# Patient Record
Sex: Female | Born: 1965 | Race: White | Hispanic: No | Marital: Married | Smoking: Never smoker
Health system: Southern US, Community
[De-identification: ages and names within clinical notes are randomized; demographics above are authoritative.]

## PROBLEM LIST (undated history)

## (undated) DIAGNOSIS — I10 Essential (primary) hypertension: Secondary | ICD-10-CM

## (undated) DIAGNOSIS — E119 Type 2 diabetes mellitus without complications: Secondary | ICD-10-CM

## (undated) DIAGNOSIS — E785 Hyperlipidemia, unspecified: Secondary | ICD-10-CM

## (undated) DIAGNOSIS — T7840XA Allergy, unspecified, initial encounter: Secondary | ICD-10-CM

## (undated) DIAGNOSIS — F419 Anxiety disorder, unspecified: Secondary | ICD-10-CM

## (undated) HISTORY — DX: Allergy, unspecified, initial encounter: T78.40XA

## (undated) HISTORY — DX: Hyperlipidemia, unspecified: E78.5

## (undated) HISTORY — DX: Anxiety disorder, unspecified: F41.9

## (undated) HISTORY — DX: Essential (primary) hypertension: I10

## (undated) HISTORY — PX: DILATION AND CURETTAGE OF UTERUS: SHX78

## (undated) HISTORY — PX: HERNIA REPAIR: SHX51

## (undated) HISTORY — DX: Type 2 diabetes mellitus without complications: E11.9

---

## 1997-04-19 ENCOUNTER — Other Ambulatory Visit: Admission: RE | Admit: 1997-04-19 | Discharge: 1997-04-19 | Payer: Self-pay | Admitting: Obstetrics and Gynecology

## 1997-09-07 ENCOUNTER — Ambulatory Visit (HOSPITAL_COMMUNITY): Admission: RE | Admit: 1997-09-07 | Discharge: 1997-09-07 | Payer: Self-pay | Admitting: Obstetrics and Gynecology

## 1997-10-28 ENCOUNTER — Inpatient Hospital Stay (HOSPITAL_COMMUNITY): Admission: AD | Admit: 1997-10-28 | Discharge: 1997-10-31 | Payer: Self-pay | Admitting: Obstetrics and Gynecology

## 1997-12-06 ENCOUNTER — Other Ambulatory Visit: Admission: RE | Admit: 1997-12-06 | Discharge: 1997-12-06 | Payer: Self-pay | Admitting: Obstetrics and Gynecology

## 2003-02-07 ENCOUNTER — Other Ambulatory Visit: Admission: RE | Admit: 2003-02-07 | Discharge: 2003-02-07 | Payer: Self-pay | Admitting: Family Medicine

## 2004-03-20 ENCOUNTER — Encounter: Admission: RE | Admit: 2004-03-20 | Discharge: 2004-03-20 | Payer: Self-pay | Admitting: Family Medicine

## 2004-10-24 ENCOUNTER — Ambulatory Visit: Payer: Self-pay | Admitting: Family Medicine

## 2004-10-24 ENCOUNTER — Other Ambulatory Visit: Admission: RE | Admit: 2004-10-24 | Discharge: 2004-10-24 | Payer: Self-pay | Admitting: Family Medicine

## 2005-03-05 ENCOUNTER — Ambulatory Visit: Payer: Self-pay | Admitting: Family Medicine

## 2005-03-18 ENCOUNTER — Ambulatory Visit: Payer: Self-pay | Admitting: Family Medicine

## 2005-03-21 ENCOUNTER — Encounter: Admission: RE | Admit: 2005-03-21 | Discharge: 2005-03-21 | Payer: Self-pay | Admitting: Family Medicine

## 2006-05-21 ENCOUNTER — Encounter: Admission: RE | Admit: 2006-05-21 | Discharge: 2006-05-21 | Payer: Self-pay | Admitting: Family Medicine

## 2008-05-24 ENCOUNTER — Encounter: Admission: RE | Admit: 2008-05-24 | Discharge: 2008-05-24 | Payer: Self-pay | Admitting: Family Medicine

## 2009-11-23 ENCOUNTER — Encounter: Admission: RE | Admit: 2009-11-23 | Discharge: 2009-11-23 | Payer: Self-pay | Admitting: Family Medicine

## 2011-06-05 ENCOUNTER — Other Ambulatory Visit: Payer: Self-pay | Admitting: Family Medicine

## 2011-06-05 DIAGNOSIS — Z1231 Encounter for screening mammogram for malignant neoplasm of breast: Secondary | ICD-10-CM

## 2011-07-09 ENCOUNTER — Ambulatory Visit
Admission: RE | Admit: 2011-07-09 | Discharge: 2011-07-09 | Disposition: A | Discharge status: Home / Self Care | Payer: 59 | Source: Ambulatory Visit | Attending: Family Medicine | Admitting: Family Medicine

## 2011-07-09 DIAGNOSIS — Z1231 Encounter for screening mammogram for malignant neoplasm of breast: Secondary | ICD-10-CM

## 2012-06-04 ENCOUNTER — Other Ambulatory Visit: Payer: Self-pay

## 2012-06-04 DIAGNOSIS — Z1231 Encounter for screening mammogram for malignant neoplasm of breast: Secondary | ICD-10-CM

## 2012-07-09 ENCOUNTER — Ambulatory Visit
Admission: RE | Admit: 2012-07-09 | Discharge: 2012-07-09 | Disposition: A | Discharge status: Home / Self Care | Payer: 59 | Source: Ambulatory Visit

## 2012-07-09 DIAGNOSIS — Z1231 Encounter for screening mammogram for malignant neoplasm of breast: Secondary | ICD-10-CM

## 2014-10-28 ENCOUNTER — Other Ambulatory Visit: Payer: Self-pay

## 2014-10-28 DIAGNOSIS — Z1231 Encounter for screening mammogram for malignant neoplasm of breast: Secondary | ICD-10-CM

## 2014-11-24 ENCOUNTER — Ambulatory Visit
Admission: RE | Admit: 2014-11-24 | Discharge: 2014-11-24 | Disposition: A | Discharge status: Home / Self Care | Payer: 59 | Source: Ambulatory Visit

## 2014-11-24 DIAGNOSIS — Z1231 Encounter for screening mammogram for malignant neoplasm of breast: Secondary | ICD-10-CM

## 2016-10-09 ENCOUNTER — Other Ambulatory Visit: Payer: Self-pay | Admitting: Family Medicine

## 2016-10-09 DIAGNOSIS — Z1231 Encounter for screening mammogram for malignant neoplasm of breast: Secondary | ICD-10-CM

## 2016-10-10 ENCOUNTER — Ambulatory Visit
Admission: RE | Admit: 2016-10-10 | Discharge: 2016-10-10 | Disposition: A | Discharge status: Home / Self Care | Payer: 59 | Source: Ambulatory Visit | Attending: Family Medicine | Admitting: Family Medicine

## 2016-10-10 DIAGNOSIS — Z1231 Encounter for screening mammogram for malignant neoplasm of breast: Secondary | ICD-10-CM

## 2019-07-02 ENCOUNTER — Other Ambulatory Visit: Payer: Self-pay | Admitting: Family Medicine

## 2019-07-02 DIAGNOSIS — Z1231 Encounter for screening mammogram for malignant neoplasm of breast: Secondary | ICD-10-CM

## 2019-07-09 ENCOUNTER — Encounter: Payer: Self-pay | Admitting: Gastroenterology

## 2019-07-15 ENCOUNTER — Other Ambulatory Visit: Payer: Self-pay

## 2019-07-15 ENCOUNTER — Other Ambulatory Visit: Payer: Self-pay | Admitting: Obstetrics and Gynecology

## 2019-07-15 ENCOUNTER — Ambulatory Visit
Admission: RE | Admit: 2019-07-15 | Discharge: 2019-07-15 | Disposition: A | Discharge status: Home / Self Care | Payer: 59 | Source: Ambulatory Visit | Attending: Family Medicine | Admitting: Family Medicine

## 2019-07-15 DIAGNOSIS — Z1231 Encounter for screening mammogram for malignant neoplasm of breast: Secondary | ICD-10-CM

## 2019-08-16 ENCOUNTER — Ambulatory Visit (AMBULATORY_SURGERY_CENTER): Payer: Self-pay | Admitting: *Deleted

## 2019-08-16 ENCOUNTER — Other Ambulatory Visit: Payer: Self-pay

## 2019-08-16 ENCOUNTER — Encounter: Payer: Self-pay | Admitting: Gastroenterology

## 2019-08-16 VITALS — Ht 62.0 in | Wt 226.8 lb

## 2019-08-16 DIAGNOSIS — Z1211 Encounter for screening for malignant neoplasm of colon: Secondary | ICD-10-CM

## 2019-08-16 MED ORDER — SUTAB 1479-225-188 MG PO TABS: 1.0000 | ORAL_TABLET | Freq: Once | ORAL | 0 refills | Status: AC

## 2019-08-16 NOTE — Progress Notes (Signed)
Completed covid vaccine 05-04-19  Pt is aware that care partner will wait in the car during procedure; if they feel like they will be too hot or cold to wait in the car; they may wait in the 4 th floor lobby. Patient is aware to bring only one care partner. We want them to wear a mask (we do not have any that we can provide them), practice social distancing, and we will check their temperatures when they get here.  I did remind the patient that their care partner needs to stay in the parking lot the entire time and have a cell phone available, we will call them when the pt is ready for discharge. Patient will wear mask into building.   No trouble with anesthesia, difficulty with intubation or hx/fam hx of malignant hyperthermia per pt   No egg or soy allergy  No home oxygen use   No medications for weight loss taken  emmi information given  Pt denies constipation issues   Sutab code put into RX and paper copy given to pt to show pharmacy

## 2019-09-02 ENCOUNTER — Ambulatory Visit (AMBULATORY_SURGERY_CENTER): Payer: 59 | Admitting: Gastroenterology

## 2019-09-02 ENCOUNTER — Other Ambulatory Visit: Payer: Self-pay

## 2019-09-02 ENCOUNTER — Encounter: Payer: Self-pay | Admitting: Gastroenterology

## 2019-09-02 VITALS — BP 113/68 | HR 68 | Temp 97.3°F | Resp 16 | Ht 62.0 in | Wt 226.0 lb

## 2019-09-02 DIAGNOSIS — Z1211 Encounter for screening for malignant neoplasm of colon: Secondary | ICD-10-CM

## 2019-09-02 DIAGNOSIS — D122 Benign neoplasm of ascending colon: Secondary | ICD-10-CM

## 2019-09-02 MED ORDER — SODIUM CHLORIDE 0.9 % IV SOLN: 500.0000 mL | Freq: Once | INTRAVENOUS | Status: DC

## 2019-09-02 NOTE — Progress Notes (Signed)
Report to PACU, RN, vss, BBS= Clear.  

## 2019-09-02 NOTE — Progress Notes (Signed)
No problems noted in the recovery room. maw 

## 2019-09-02 NOTE — Op Note (Signed)
[place] Patient Name: Brittany Reyes Procedure Date: 09/02/2019 7:37 AM MRN: [id number] Endoscopist: Mauri Pole , MD Age: 54 Referring MD:  Date of Birth: 28-Feb-1965 Gender: Female Account #: [id number] Procedure:                Colonoscopy Indications:              Screening for colorectal malignant neoplasm Medicines:                Monitored Anesthesia Care Procedure:                Pre-Anesthesia Assessment:                           - Prior to the procedure, a History and Physical                            was performed, and patient medications and                            allergies were reviewed. The patient's tolerance of                            previous anesthesia was also reviewed. The risks                            and benefits of the procedure and the sedation                            options and risks were discussed with the patient.                            All questions were answered, and informed consent                            was obtained. Prior Anticoagulants: The patient has                            taken no previous anticoagulant or antiplatelet                            agents. ASA Grade Assessment: II - A patient with                            mild systemic disease. After reviewing the risks                            and benefits, the patient was deemed in                            satisfactory condition to undergo the procedure.                           After obtaining informed consent, the colonoscope  was passed under direct vision. Throughout the                            procedure, the patient's blood pressure, pulse, and                            oxygen saturations were monitored continuously. The                            Colonoscope was introduced through the anus and                            advanced to the the cecum, identified by                            appendiceal orifice and  ileocecal valve. The                            colonoscopy was performed without difficulty. The                            patient tolerated the procedure well. The quality                            of the bowel preparation was adequate to identify                            polyps 6 mm and larger in size. The ileocecal                            valve, appendiceal orifice, and rectum were                            photographed. Scope In: 8:09:22 AM Scope Out: 8:27:03 AM Scope Withdrawal Time: 0 hours 10 minutes 8 seconds  Total Procedure Duration: 0 hours 17 minutes 41 seconds  Findings:                 The perianal and digital rectal examinations were                            normal.                           A less than 1 mm polyp was found in the ascending                            colon. The polyp was sessile. The polyp was removed                            with a hot snare. Resection was complete, and                            retrieval was complete.  A few small-mouthed diverticula were found in the                            sigmoid colon.                           Non-bleeding internal hemorrhoids were found during                            retroflexion. The hemorrhoids were small. Complications:            No immediate complications. Estimated Blood Loss:     Estimated blood loss was minimal. Impression:               - One less than 1 mm polyp in the ascending colon,                            removed with a hot snare. Resected and retrieved.                           - Diverticulosis in the sigmoid colon.                           - Non-bleeding internal hemorrhoids. Recommendation:           - Patient has a contact number available for                            emergencies. The signs and symptoms of potential                            delayed complications were discussed with the                            patient. Return to normal  activities tomorrow.                            Written discharge instructions were provided to the                            patient.                           - Resume previous diet.                           - Continue present medications.                           - Await pathology results.                           - Repeat colonoscopy in 5 years for surveillance                            based on pathology results.                           -  For future colonoscopy the patient will require                            an extended preparation. If there are any                            questions, please contact the gastroenterologist. Mauri Pole, MD 09/02/2019 8:39:33 AM This report has been signed electronically.

## 2019-09-02 NOTE — Patient Instructions (Addendum)
Handouts were given to you on polyps, diverticulosis, and hemorrhoids. Your blood sugar was 94 in the recovery room. You may resume your current medications today. Await biopsy results. Repeat colonoscopy in 5 years for surveillance with a double prep. Please call if any questions or concerns.      YOU HAD AN ENDOSCOPIC PROCEDURE TODAY AT [facility]:   Refer to the procedure report that was given to you for any specific questions about what was found during the examination.  If the procedure report does not answer your questions, please call your gastroenterologist to clarify.  If you requested that your care partner not be given the details of your procedure findings, then the procedure report has been included in a sealed envelope for you to review at your convenience later.  YOU SHOULD EXPECT: Some feelings of bloating in the abdomen. Passage of more gas than usual.  Walking can help get rid of the air that was put into your GI tract during the procedure and reduce the bloating. If you had a lower endoscopy (such as a colonoscopy or flexible sigmoidoscopy) you may notice spotting of blood in your stool or on the toilet paper. If you underwent a bowel prep for your procedure, you may not have a normal bowel movement for a few days.  Please Note:  You might notice some irritation and congestion in your nose or some drainage.  This is from the oxygen used during your procedure.  There is no need for concern and it should clear up in a day or so.  SYMPTOMS TO REPORT IMMEDIATELY:   Following lower endoscopy (colonoscopy or flexible sigmoidoscopy):  Excessive amounts of blood in the stool  Significant tenderness or worsening of abdominal pains  Swelling of the abdomen that is new, acute  Fever of 100F or higher   For urgent or emergent issues, a gastroenterologist can be reached at any hour by calling [phone]. Do not use MyChart messaging for urgent concerns.     DIET:  We do recommend a small meal at first, but then you may proceed to your regular diet.  Drink plenty of fluids but you should avoid alcoholic beverages for 24 hours.  ACTIVITY:  You should plan to take it easy for the rest of today and you should NOT DRIVE or use heavy machinery until tomorrow (because of the sedation medicines used during the test).    FOLLOW UP: Our staff will call the number listed on your records 48-72 hours following your procedure to check on you and address any questions or concerns that you may have regarding the information given to you following your procedure. If we do not reach you, we will leave a message.  We will attempt to reach you two times.  During this call, we will ask if you have developed any symptoms of COVID 19. If you develop any symptoms (ie: fever, flu-like symptoms, shortness of breath, cough etc.) before then, please call [phone].  If you test positive for Covid 19 in the 2 weeks post procedure, please call and report this information to [place].    If any biopsies were taken you will be contacted by phone or by letter within the next 1-3 weeks.  Please call us at [phone] if you have not heard about the biopsies in 3 weeks.    SIGNATURES/CONFIDENTIALITY: You and/or your care partner have signed paperwork which will be entered into your electronic medical record.  These signatures attest to  the fact that that the information above on your After Visit Summary has been reviewed and is understood.  Full responsibility of the confidentiality of this discharge information lies with you and/or your care-partner.

## 2019-09-02 NOTE — Progress Notes (Signed)
DR N aware of Robinul

## 2019-09-02 NOTE — Progress Notes (Signed)
Called to room to assist during endoscopic procedure.  Patient ID and intended procedure confirmed with present staff. Received instructions for my participation in the procedure from the performing physician.  

## 2019-09-06 ENCOUNTER — Telehealth: Payer: Self-pay

## 2019-09-06 NOTE — Telephone Encounter (Signed)
  Follow up Call-  Call back number 09/02/2019  Post procedure Call Back phone  # [phone]  Permission to leave phone message Yes  Some recent data might be hidden     Patient questions:  Do you have a fever, pain , or abdominal swelling? No. Pain Score  0 *  Have you tolerated food without any problems? Yes.    Have you been able to return to your normal activities? Yes.    Do you have any questions about your discharge instructions: Diet   No. Medications  No. Follow up visit  No.  Do you have questions or concerns about your Care? No.  Actions: * If pain score is 4 or above: 1. No action needed, pain <4.Have you developed a fever since your procedure? no  2.   Have you had an respiratory symptoms (SOB or cough) since your procedure? no  3.   Have you tested positive for COVID 19 since your procedure no  4.   Have you had any family members/close contacts diagnosed with the COVID 19 since your procedure?  no   If yes to any of these questions please route to Joylene John, RN and Joella Prince, RN

## 2019-09-10 ENCOUNTER — Encounter: Payer: Self-pay | Admitting: Gastroenterology

## 2020-07-25 LAB — HM MAMMOGRAPHY

## 2020-07-25 LAB — HM DEXA SCAN

## 2020-07-29 LAB — HM PAP SMEAR: HM Pap smear: NORMAL

## 2021-04-27 LAB — HM DIABETES EYE EXAM

## 2021-10-16 ENCOUNTER — Encounter: Payer: Self-pay | Admitting: Family

## 2021-10-16 ENCOUNTER — Ambulatory Visit (INDEPENDENT_AMBULATORY_CARE_PROVIDER_SITE_OTHER): Payer: 59 | Admitting: Family

## 2021-10-16 ENCOUNTER — Telehealth: Payer: Self-pay | Admitting: Family

## 2021-10-16 ENCOUNTER — Telehealth: Payer: Self-pay

## 2021-10-16 VITALS — BP 130/64 | HR 95 | Temp 98.5°F | Ht 63.0 in | Wt 210.0 lb

## 2021-10-16 DIAGNOSIS — E785 Hyperlipidemia, unspecified: Secondary | ICD-10-CM | POA: Diagnosis not present

## 2021-10-16 DIAGNOSIS — Z23 Encounter for immunization: Secondary | ICD-10-CM | POA: Diagnosis not present

## 2021-10-16 DIAGNOSIS — T7840XA Allergy, unspecified, initial encounter: Secondary | ICD-10-CM

## 2021-10-16 DIAGNOSIS — E119 Type 2 diabetes mellitus without complications: Secondary | ICD-10-CM | POA: Diagnosis not present

## 2021-10-16 DIAGNOSIS — I10 Essential (primary) hypertension: Secondary | ICD-10-CM | POA: Diagnosis not present

## 2021-10-16 DIAGNOSIS — F419 Anxiety disorder, unspecified: Secondary | ICD-10-CM | POA: Diagnosis not present

## 2021-10-16 DIAGNOSIS — B009 Herpesviral infection, unspecified: Secondary | ICD-10-CM

## 2021-10-16 LAB — COMPREHENSIVE METABOLIC PANEL
ALT: 11 U/L (ref 0–35)
AST: 13 U/L (ref 0–37)
Albumin: 4.8 g/dL (ref 3.5–5.2)
Alkaline Phosphatase: 49 U/L (ref 39–117)
BUN: 24 mg/dL — ABNORMAL HIGH (ref 6–23)
CO2: 26 mEq/L (ref 19–32)
Calcium: 9.8 mg/dL (ref 8.4–10.5)
Chloride: 99 mEq/L (ref 96–112)
Creatinine, Ser: 0.7 mg/dL (ref 0.40–1.20)
GFR: 95.3 mL/min (ref >60.00)
Glucose, Bld: 116 mg/dL — ABNORMAL HIGH (ref 70–99)
Potassium: 3.5 mEq/L (ref 3.5–5.1)
Sodium: 138 mEq/L (ref 135–145)
Total Bilirubin: 0.8 mg/dL (ref 0.2–1.2)
Total Protein: 7.3 g/dL (ref 6.0–8.3)

## 2021-10-16 LAB — LIPID PANEL
Cholesterol: 152 mg/dL (ref 0–200)
HDL: 48.3 mg/dL (ref >39.00)
LDL Cholesterol: 81 mg/dL (ref 0–99)
NonHDL: 104.19
Total CHOL/HDL Ratio: 3
Triglycerides: 115 mg/dL (ref 0.0–149.0)
VLDL: 23 mg/dL (ref 0.0–40.0)

## 2021-10-16 LAB — MICROALBUMIN / CREATININE URINE RATIO
Creatinine,U: 30.9 mg/dL
Microalb Creat Ratio: 2.3 mg/g (ref 0.0–30.0)
Microalb, Ur: <0.7 mg/dL (ref 0.0–1.9)

## 2021-10-16 LAB — HEMOGLOBIN A1C: Hgb A1c MFr Bld: 6.3 % (ref 4.6–6.5)

## 2021-10-16 MED ORDER — PRAVASTATIN SODIUM 40 MG PO TABS: 40.0000 mg | ORAL_TABLET | Freq: Every day | ORAL | 1 refills | Status: DC

## 2021-10-16 MED ORDER — TELMISARTAN-HCTZ 80-25 MG PO TABS: 1.0000 | ORAL_TABLET | Freq: Every day | ORAL | 1 refills | Status: DC

## 2021-10-16 MED ORDER — EZETIMIBE 10 MG PO TABS: 10.0000 mg | ORAL_TABLET | Freq: Every day | ORAL | 1 refills | Status: DC

## 2021-10-16 MED ORDER — VENLAFAXINE HCL ER 150 MG PO CP24: 150.0000 mg | ORAL_CAPSULE | Freq: Every day | ORAL | 1 refills | Status: DC

## 2021-10-16 MED ORDER — FAMCICLOVIR 500 MG PO TABS: ORAL_TABLET | ORAL | 4 refills | Status: DC

## 2021-10-16 MED ORDER — SYNJARDY XR 25-1000 MG PO TB24: 1.0000 | ORAL_TABLET | Freq: Every day | ORAL | 1 refills | Status: DC

## 2021-10-16 NOTE — Assessment & Plan Note (Signed)
Has been on lexapro in the past. Ultimately was switched to effexor and has been stable for about 20 years.

## 2021-10-16 NOTE — Assessment & Plan Note (Signed)
Uses famvir prn breakout. She also sometimes has oral cold sores that she uses famvir for.

## 2021-10-16 NOTE — Assessment & Plan Note (Signed)
At goal on micardis HCT. Continue same.

## 2021-10-16 NOTE — Progress Notes (Addendum)
Subjective:   By signing my name below, I, Brittany Reyes, attest that this documentation has been prepared under the direction and in the presence of Brittany Chimera, NP 10/16/2021     Patient ID: Brittany Reyes, female    DOB: Dec 29, 1965, 56 y.o.   MRN: [id number]  Chief Complaint  Patient presents with   Establish Care    Possible refills     HPI Patient is in today for establishment of patient care  Social History: She is married and has two children. Her daughter works for [employer] and her son works and lives in [place]. She works as an Optometrist. She enjoys reading and hanging out with friends. She currently has a dog. The highest level of educational completion for her is a bachelor's degree.   Hypertension: She has a history of hypertension. She is currently taking 80-25 Micardis-HCT BP Readings from Last 3 Encounters:  10/16/21 130/64  09/02/19 113/68   Pulse Readings from Last 3 Encounters:  10/16/21 95  09/02/19 68   Cholesterol: She is currently taking 10 Mg of Zetia. She was supposed to take Zetia with Pravastatin. Since starting her Octavia diet, she only takes the Zetia medication. She reports a history of atherosclerosis. She previously was prescribed 40 Mg of Pravastatin.  No results found for: "CHOL", "HDL", "LDLCALC", "LDLDIRECT", "TRIG", "CHOLHDL"  Diabetes: She reports that she was diagnosed with diabetes around the age of 56 years old. She is currently taking 25-1000 Mg of Synjardy XR. She does not check her blood sugars at home.She reports that her last A1C was 5.17. No results found for: "HGBA1C"  Anxiety: She has a history of anxiety. Symptoms appeared after her son was born and her father passed away afterwards. She had a panic attack around that time. She was previously on Lexapro and responded well to it and is now taking 150 Mg of Effexor-XR. She started the medication around 2005/06.   Allergies: She reports that her symptoms are seasonal. She  takes Claritin PRN.   Famcicolvir: She takes 500 Mg of Famcicolvir for herpes and uses it mostly in the winter to help with cold sores. She has a history of genital herpes.   Diet: She is doing the diet Harle Battiest, she states that it was going really well until she back-tracked but is now back on the diet. Wt Readings from Last 3 Encounters:  10/16/21 210 lb (95.3 kg)  09/02/19 226 lb (102.5 kg)  08/16/19 (!) 226 lb 12.8 oz (102.9 kg)   Enlarged Lymph Node: She reports that she had a CT scan done for an enlarged lymph node and reports a negative result.  Family History: She reports that her mother has vascular dementia as well as her maternal grandmother. Her maternal grandfather had a stroke. Her father passed from cardiac arrest at the age of 53. He also had a tumor behind his ear which resulted in him having hearing loss. Her paternal grandfather passed from a stroke and her paternal grandmother passed from breast cancer. She reports that her sister has a heart murmur in her mitral valve. Her oldest bother passed from suicide. Her younger bother has sleeping symptoms.   Immunizations: She is UTD on her pneumonia and Tdap vaccines. She is considering taking the Shingles vaccine but is not committed to receiving it yet. She is interested in receiving her influenza vaccine during today's visit.   Pap Smear: She sees a gynecologist for pap smears  Vision: She is UTD on vision  exams. She goes to [facility] Due  Topic Date Due   HEMOGLOBIN A1C  Never done   FOOT EXAM  Never done   OPHTHALMOLOGY EXAM  Never done   HIV Screening  Never done   Diabetic kidney evaluation - GFR measurement  Never done   Hepatitis C Screening  Never done   PAP SMEAR-Modifier  Never done   Zoster Vaccines- Shingrix (1 of 2) Never done   COVID-19 Vaccine (3 - Moderna series) 06/29/2019   MAMMOGRAM  07/14/2021    Past Medical History:  Diagnosis Date   Allergy    Anxiety    Diabetes mellitus  without complication ([place])    Hyperlipidemia    Hypertension     Past Surgical History:  Procedure Laterality Date   DILATION AND CURETTAGE OF UTERUS     1998   HERNIA REPAIR     age 34 and 60    Family History  Problem Relation Age of Onset   Dementia Mother    Heart attack Father    Valvular heart disease Sister        Mitral valve replacement   Suicidality Brother    Dementia Maternal Grandmother    CVA Maternal Grandfather    Breast cancer Paternal Grandmother    CVA Paternal Grandfather    Colon cancer Neg Hx    Esophageal cancer Neg Hx    Rectal cancer Neg Hx    Stomach cancer Neg Hx     Social History   Socioeconomic History   Marital status: Married    Spouse name: Not on file   Number of children: Not on file   Years of education: Not on file   Highest education level: Not on file  Occupational History   Not on file  Tobacco Use   Smoking status: Never   Smokeless tobacco: Never  Vaping Use   Vaping Use: Never used  Substance and Sexual Activity   Alcohol use: Yes    Comment: socially   Drug use: Never   Sexual activity: Yes    Partners: Male  Other Topics Concern   Not on file  Social History Narrative   Married   2 children   1 grandchild   Son in [place]   Daughter locally   Works as an Optometrist   Enjoys reading, spending time with friends.    Has 1 dog   Completed bachelors degree   Social Determinants of Radio broadcast assistant Strain: Not on file  Food Insecurity: Not on file  Transportation Needs: Not on file  Physical Activity: Not on file  Stress: Not on file  Social Connections: Not on file  Intimate Partner Violence: Not on file    Outpatient Medications Prior to Visit  Medication Sig Dispense Refill   loratadine (CLARITIN) 10 MG tablet Take by mouth daily.     Empagliflozin-metFORMIN HCl ER (SYNJARDY XR) 25-1000 MG TB24 Take 1 tablet by mouth daily.     ezetimibe (ZETIA) 10 MG tablet Take 10 mg by mouth daily.      famciclovir (FAMVIR) 500 MG tablet as needed.     telmisartan-hydrochlorothiazide (MICARDIS HCT) 80-25 MG tablet daily.     venlafaxine XR (EFFEXOR-XR) 150 MG 24 hr capsule Take by mouth daily.     MAGNESIUM PO Take by mouth daily.     pravastatin (PRAVACHOL) 40 MG tablet Take by mouth daily.     No facility-administered medications prior to visit.  Allergies  Allergen Reactions   Ace Inhibitors Other (See Comments)    Cough Cough     ROS See HPI    Objective:    Physical Exam Constitutional:      General: She is not in acute distress.    Appearance: Normal appearance. She is not ill-appearing.  HENT:     Head: Normocephalic and atraumatic.     Right Ear: External ear normal.     Left Ear: External ear normal.  Eyes:     Extraocular Movements: Extraocular movements intact.     Pupils: Pupils are equal, round, and reactive to light.  Neck:     Thyroid: No thyromegaly.  Cardiovascular:     Rate and Rhythm: Normal rate and regular rhythm.     Pulses:          Carotid pulses are 2+ on the right side and 2+ on the left side.    Heart sounds: Normal heart sounds. No murmur heard.    No gallop.  Pulmonary:     Effort: Pulmonary effort is normal. No respiratory distress.     Breath sounds: Normal breath sounds. No wheezing or rales.  Lymphadenopathy:     Cervical: No cervical adenopathy.  Skin:    General: Skin is warm and dry.  Neurological:     Mental Status: She is alert and oriented to person, place, and time.  Psychiatric:        Mood and Affect: Mood normal.        Behavior: Behavior normal.        Judgment: Judgment normal.     BP 130/64   Pulse 95   Temp 98.5 F (36.9 C) (Oral)   Ht _0  (1.6 m)   Wt 210 lb (95.3 kg)   SpO2 97%   BMI 37.20 kg/m  Wt Readings from Last 3 Encounters:  10/16/21 210 lb (95.3 kg)  09/02/19 226 lb (102.5 kg)  08/16/19 (!) 226 lb 12.8 oz (102.9 kg)       Assessment & Plan:   Problem List Items Addressed This  Visit       Unprioritized   Hypertension - Primary    At goal on micardis HCT. Continue same.       Relevant Medications   pravastatin (PRAVACHOL) 40 MG tablet   telmisartan-hydrochlorothiazide (MICARDIS HCT) 80-25 MG tablet   ezetimibe (ZETIA) 10 MG tablet   Other Relevant Orders   Comp Met (CMET)   Hyperlipidemia    She has only been using zetia recently.  Will check lipid panel and restart pravastatin 52m which she was on previously.       Relevant Medications   pravastatin (PRAVACHOL) 40 MG tablet   telmisartan-hydrochlorothiazide (MICARDIS HCT) 80-25 MG tablet   ezetimibe (ZETIA) 10 MG tablet   Other Relevant Orders   Lipid panel   genital herpes    Uses famvir prn breakout. She also sometimes has oral cold sores that she uses famvir for.       Relevant Medications   famciclovir (FAMVIR) 500 MG tablet   Diabetes mellitus without complication (HCC)    Not checking sugars at home.  Maintained on Synjardy. Last A1C 5.7 in February. Flu shot today. Continues to work on weight loss. DM eye exam up to date. Will request copy.  Wt Readings from Last 3 Encounters:  10/16/21 210 lb (95.3 kg)  09/02/19 226 lb (102.5 kg)  08/16/19 (!) 226 lb 12.8 oz (102.9 kg)  Relevant Medications   pravastatin (PRAVACHOL) 40 MG tablet   telmisartan-hydrochlorothiazide (MICARDIS HCT) 80-25 MG tablet   Empagliflozin-metFORMIN HCl ER (SYNJARDY XR) 25-1000 MG TB24   Other Relevant Orders   Hemoglobin A1c   Urine Microalbumin w/creat. ratio   Anxiety    Has been on lexapro in the past. Ultimately was switched to effexor and has been stable for about 20 years.       Relevant Medications   venlafaxine XR (EFFEXOR-XR) 150 MG 24 hr capsule   Allergy    Seasonal allergies. Uses claritin as needed.       Other Visit Diagnoses     Need for immunization against influenza       Relevant Orders   Flu Vaccine QUAD 61moIM (Fluarix, Fluzone & Alfiuria Quad PF) (Completed)      Meds  ordered this encounter  Medications   pravastatin (PRAVACHOL) 40 MG tablet    Sig: Take 1 tablet (40 mg total) by mouth daily.    Dispense:  90 tablet    Refill:  1    Order Specific Question:   Supervising Provider    Answer:   BPenni HomansA [[id number]]   famciclovir (FAMVIR) 500 MG tablet    Sig: Take 2 tabs by mouth now and 2 tabs in 12 hours at start of outbreak.    Dispense:  12 tablet    Refill:  4    Order Specific Question:   Supervising Provider    Answer:   BPenni HomansA [[id number]]   venlafaxine XR (EFFEXOR-XR) 150 MG 24 hr capsule    Sig: Take 1 capsule (150 mg total) by mouth daily.    Dispense:  90 capsule    Refill:  1    Order Specific Question:   Supervising Provider    Answer:   BPenni HomansA [[id number]]   telmisartan-hydrochlorothiazide (MICARDIS HCT) 80-25 MG tablet    Sig: Take 1 tablet by mouth daily.    Dispense:  90 tablet    Refill:  1    Order Specific Question:   Supervising Provider    Answer:   BPenni HomansA [[id number]]   ezetimibe (ZETIA) 10 MG tablet    Sig: Take 1 tablet (10 mg total) by mouth daily.    Dispense:  90 tablet    Refill:  1    Order Specific Question:   Supervising Provider    Answer:   BPenni HomansA [[id number]]   Empagliflozin-metFORMIN HCl ER (SYNJARDY XR) 25-1000 MG TB24    Sig: Take 1 tablet by mouth daily.    Dispense:  90 tablet    Refill:  1    Order Specific Question:   Supervising Provider    Answer:   BPenni HomansA [[id number]]    I, MNance Pear NP, personally preformed the services described in this documentation.  All medical record entries made by the scribe were at my direction and in my presence.  I have reviewed the chart and discharge instructions (if applicable) and agree that the record reflects my personal performance and is accurate and complete. 10/16/2021   I,Amber Collins,acting as a scribe for MNance Pear NP.,have documented all relevant documentation on the behalf of MNance Pear NP,as  directed by  MNance Pear NP while in the presence of MNance Pear NP.    MNance Pear NP

## 2021-10-16 NOTE — Telephone Encounter (Signed)
Rec release has been faxed.

## 2021-10-16 NOTE — Telephone Encounter (Signed)
Please call [facility] in [place] to request copy of DM eye exam.

## 2021-10-16 NOTE — Telephone Encounter (Signed)
PA started:   Doristine Bosworth Key: [id number] - PA Case ID: [id number] - Rx #: [id number]

## 2021-10-16 NOTE — Assessment & Plan Note (Addendum)
She has only been using zetia recently.  Will check lipid panel and restart pravastatin '40mg'$  which she was on previously.

## 2021-10-16 NOTE — Assessment & Plan Note (Addendum)
Not checking sugars at home.  Maintained on Synjardy. Last A1C 5.7 in February. Flu shot today. Continues to work on weight loss. DM eye exam up to date. Will request copy.  Wt Readings from Last 3 Encounters:  10/16/21 210 lb (95.3 kg)  09/02/19 226 lb (102.5 kg)  08/16/19 (!) 226 lb 12.8 oz (102.9 kg)

## 2021-10-16 NOTE — Assessment & Plan Note (Signed)
Seasonal allergies. Uses claritin as needed.

## 2021-10-18 NOTE — Telephone Encounter (Signed)
PA approved.   Request Reference Number: [id number]. EZETIMIBE TAB '10MG'$  is approved through 10/17/2022. Your patient may now fill this prescription and it will be covered.

## 2021-11-20 ENCOUNTER — Encounter: Payer: 59 | Admitting: Family

## 2021-12-04 ENCOUNTER — Ambulatory Visit (INDEPENDENT_AMBULATORY_CARE_PROVIDER_SITE_OTHER): Payer: 59 | Admitting: Family

## 2021-12-04 ENCOUNTER — Encounter: Payer: Self-pay | Admitting: Family

## 2021-12-04 VITALS — BP 116/74 | HR 88 | Temp 99.3°F | Resp 16 | Ht 62.0 in | Wt 205.0 lb

## 2021-12-04 DIAGNOSIS — Z0000 Encounter for general adult medical examination without abnormal findings: Secondary | ICD-10-CM | POA: Insufficient documentation

## 2021-12-04 DIAGNOSIS — Z1159 Encounter for screening for other viral diseases: Secondary | ICD-10-CM

## 2021-12-04 DIAGNOSIS — E785 Hyperlipidemia, unspecified: Secondary | ICD-10-CM

## 2021-12-04 DIAGNOSIS — Z114 Encounter for screening for human immunodeficiency virus [HIV]: Secondary | ICD-10-CM

## 2021-12-04 NOTE — Assessment & Plan Note (Signed)
Wt Readings from Last 3 Encounters:  12/04/21 205 lb (93 kg)  10/16/21 210 lb (95.3 kg)  09/02/19 226 lb (102.5 kg)   Continue work on [employer] and regular exercise efforts. Pap per gyn/mammo per gyn Colo up to date.

## 2021-12-04 NOTE — Progress Notes (Signed)
Subjective:   By signing my name below, I, [name], attest that this documentation has been prepared under the direction and in the presence of [name], NP 12/04/2021   Patient ID: Brittany Reyes, female    DOB: Aug 21, 1965, 56 y.o.   MRN: [id number]  Chief Complaint  Patient presents with   Annual Exam    HPI Patient is in today for a comprehensive physical exam  Blood Pressure: Her blood pressure is normal BP Readings from Last 3 Encounters:  12/04/21 116/74  10/16/21 130/64  09/02/19 113/68   Pulse Readings from Last 3 Encounters:  12/04/21 88  10/16/21 95  09/02/19 68   Cholesterol: Since her last visit, she has restarted her 40 mg of Pravastatin medication. She notes of side effects of heartburn. She does not snack.  Lab Results  Component Value Date   CHOL 152 10/16/2021   HDL 48.30 10/16/2021   LDLCALC 81 10/16/2021   TRIG 115.0 10/16/2021   CHOLHDL 3 10/16/2021   Virus: She reports that she had a potential virus last week. Symptoms include vomiting and generalized body aches. She was prescribed Prednisone for her symptoms. As of today's visit, symptoms are resolved.   Glands Behind Neck: She complains of painful glands behind her neck. She believes that it could be due to the previous virus she encountered.   She denies having any fever, hearing or vision symptoms, new muscle pain, joint pain , new moles, rashes, congestion, sinus pain, sore throat, palpations, cough, SOB ,wheezing,n/v/d constipation, blood in stool, dysuria, frequency, hematuria, depression, anxiety, headaches at this time  Social history: She denies of any changes to her family medical history. She reports no recent surgeries.  Colonoscopy: Last completed on 09/02/2019 Pap Smear: She is currently being seen at [facility]. She is scheduled for a pap smear at 02/07/2022 Mammogram: She is scheduled for an updated mammogram on 02/07/2022 Immunizations: She is not  interested in receiving the Shingles vaccine at this moment. She is interested in taking the vaccine sometime on a Friday. She is interested in doing a HIV/HepC screening.  Diet: She is maintaining a healthy diet. She is currently using the [place] program Exercise: She is not regularly exercising due to work and daylight savings time ending.  Dental: She is UTD on dental exams Vision: She is UTD on vision exams   Health Maintenance Due  Topic Date Due   FOOT EXAM  Never done   Hepatitis C Screening  Never done   PAP SMEAR-Modifier  Never done   Zoster Vaccines- Shingrix (1 of 2) Never done   MAMMOGRAM  07/14/2021   COVID-19 Vaccine (3 - 2023-24 season) 09/14/2021    Past Medical History:  Diagnosis Date   Allergy    Anxiety    Diabetes mellitus without complication ([place])    Hyperlipidemia    Hypertension     Past Surgical History:  Procedure Laterality Date   DILATION AND CURETTAGE OF UTERUS     1998   HERNIA REPAIR     age 55 and 27    Family History  Problem Relation Age of Onset   Dementia Mother    Heart attack Father    Valvular heart disease Sister        Mitral valve replacement   Suicidality Brother    Dementia Maternal Grandmother    CVA Maternal Grandfather    Breast cancer Paternal Grandmother    CVA Paternal Grandfather    Colon  cancer Neg Hx    Esophageal cancer Neg Hx    Rectal cancer Neg Hx    Stomach cancer Neg Hx     Social History   Socioeconomic History   Marital status: Married    Spouse name: Not on file   Number of children: Not on file   Years of education: Not on file   Highest education level: Not on file  Occupational History   Not on file  Tobacco Use   Smoking status: Never   Smokeless tobacco: Never  Vaping Use   Vaping Use: Never used  Substance and Sexual Activity   Alcohol use: Yes    Comment: socially   Drug use: Never   Sexual activity: Yes    Partners: Male  Other Topics Concern   Not on file  Social History  Narrative   Married   2 children   1 grandchild   Son in [place]   Daughter locally   Works as an Optometrist   Enjoys reading, spending time with friends.    Has 1 dog   Completed bachelors degree   Social Determinants of Radio broadcast assistant Strain: Not on file  Food Insecurity: Not on file  Transportation Needs: Not on file  Physical Activity: Not on file  Stress: Not on file  Social Connections: Not on file  Intimate Partner Violence: Not on file    Outpatient Medications Prior to Visit  Medication Sig Dispense Refill   Empagliflozin-metFORMIN HCl ER (SYNJARDY XR) 25-1000 MG TB24 Take 1 tablet by mouth daily. 90 tablet 1   ezetimibe (ZETIA) 10 MG tablet Take 1 tablet (10 mg total) by mouth daily. 90 tablet 1   famciclovir (FAMVIR) 500 MG tablet Take 2 tabs by mouth now and 2 tabs in 12 hours at start of outbreak. 12 tablet 4   loratadine (CLARITIN) 10 MG tablet Take by mouth daily.     pravastatin (PRAVACHOL) 40 MG tablet Take 1 tablet (40 mg total) by mouth daily. 90 tablet 1   telmisartan-hydrochlorothiazide (MICARDIS HCT) 80-25 MG tablet Take 1 tablet by mouth daily. 90 tablet 1   venlafaxine XR (EFFEXOR-XR) 150 MG 24 hr capsule Take 1 capsule (150 mg total) by mouth daily. 90 capsule 1   No facility-administered medications prior to visit.    Allergies  Allergen Reactions   Ace Inhibitors Other (See Comments)    Cough Cough     Review of Systems  Constitutional:  Negative for fever.  HENT:  Negative for congestion, sinus pain and sore throat.        (+) Gland Pain Behind Neck  Respiratory:  Negative for cough, shortness of breath and wheezing.   Cardiovascular:  Negative for palpitations.  Gastrointestinal:  Negative for blood in stool, constipation, diarrhea, nausea and vomiting.  Genitourinary:  Negative for dysuria, frequency and hematuria.  Musculoskeletal:  Negative for joint pain and myalgias.  Skin:  Negative for rash.       (-) New Moles   Neurological:  Negative for headaches.  Psychiatric/Behavioral:  Negative for depression. The patient is not nervous/anxious.        Objective:    Physical Exam Constitutional:      General: She is not in acute distress.    Appearance: Normal appearance. She is not ill-appearing.  HENT:     Head: Normocephalic and atraumatic.     Right Ear: Tympanic membrane, ear canal and external ear normal.     Left Ear: Tympanic  membrane, ear canal and external ear normal.     Mouth/Throat:     Pharynx: No oropharyngeal exudate.  Eyes:     Extraocular Movements: Extraocular movements intact.     Pupils: Pupils are equal, round, and reactive to light.  Neck:     Thyroid: No thyromegaly.  Cardiovascular:     Rate and Rhythm: Normal rate and regular rhythm.     Pulses:          Dorsalis pedis pulses are 2+ on the right side and 2+ on the left side.       Posterior tibial pulses are 2+ on the right side and 2+ on the left side.     Heart sounds: Normal heart sounds. No murmur heard.    No gallop.  Pulmonary:     Effort: Pulmonary effort is normal. No respiratory distress.     Breath sounds: Normal breath sounds. No wheezing or rales.  Abdominal:     General: Bowel sounds are normal. There is no distension.     Palpations: Abdomen is soft.     Tenderness: There is no abdominal tenderness. There is no guarding.  Musculoskeletal:     Comments: 5/5 strength in both upper and lower extremities    Lymphadenopathy:     Cervical: No cervical adenopathy.  Skin:    General: Skin is warm and dry.  Neurological:     Mental Status: She is alert and oriented to person, place, and time.     Deep Tendon Reflexes:     Reflex Scores:      Patellar reflexes are 2+ on the right side and 2+ on the left side. Psychiatric:        Mood and Affect: Mood normal.        Behavior: Behavior normal.        Judgment: Judgment normal.     BP 116/74 (BP Location: Right Arm, Patient Position: Sitting, Cuff  Size: Large)   Pulse 88   Temp 99.3 F (37.4 C) (Oral)   Resp 16   Ht '5\' 2"'$  (1.575 m)   Wt 205 lb (93 kg)   SpO2 100%   BMI 37.49 kg/m  Wt Readings from Last 3 Encounters:  12/04/21 205 lb (93 kg)  10/16/21 210 lb (95.3 kg)  09/02/19 226 lb (102.5 kg)   Diabetic Foot Exam - Simple   No data filed        Assessment & Plan:   Problem List Items Addressed This Visit       Unprioritized   Preventative health care    Wt Readings from Last 3 Encounters:  12/04/21 205 lb (93 kg)  10/16/21 210 lb (95.3 kg)  09/02/19 226 lb (102.5 kg)  Continue work on [employer] and regular exercise efforts. Pap per gyn/mammo per gyn Colo up to date.        Hyperlipidemia    Lab Results  Component Value Date   CHOL 152 10/16/2021   HDL 48.30 10/16/2021   LDLCALC 81 10/16/2021   TRIG 115.0 10/16/2021   CHOLHDL 3 10/16/2021  Back on pravastatin. Will recheck lipid panel.       Relevant Orders   Lipid panel   Other Visit Diagnoses     Need for hepatitis C screening test    -  Primary   Relevant Orders   Hepatitis C Antibody   Encounter for screening for HIV       Relevant Orders   HIV antibody (with  reflex)      No orders of the defined types were placed in this encounter.   I, Nance Pear, NP, personally preformed the services described in this documentation.  All medical record entries made by the scribe were at my direction and in my presence.  I have reviewed the chart and discharge instructions (if applicable) and agree that the record reflects my personal performance and is accurate and complete. 12/04/2021   I,Amber Collins,acting as a scribe for Nance Pear, NP.,have documented all relevant documentation on the behalf of Nance Pear, NP,as directed by  Nance Pear, NP while in the presence of Nance Pear, NP.    Nance Pear, NP

## 2021-12-04 NOTE — Assessment & Plan Note (Signed)
Lab Results  Component Value Date   CHOL 152 10/16/2021   HDL 48.30 10/16/2021   LDLCALC 81 10/16/2021   TRIG 115.0 10/16/2021   CHOLHDL 3 10/16/2021   Back on pravastatin. Will recheck lipid panel.

## 2021-12-05 LAB — LIPID PANEL
Cholesterol: 139 mg/dL (ref 0–200)
HDL: 43.9 mg/dL
LDL Cholesterol: 70 mg/dL (ref 0–99)
NonHDL: 95.04
Total CHOL/HDL Ratio: 3
Triglycerides: 123 mg/dL (ref 0.0–149.0)
VLDL: 24.6 mg/dL (ref 0.0–40.0)

## 2021-12-05 LAB — HIV ANTIBODY (ROUTINE TESTING W REFLEX): HIV 1&2 Ab, 4th Generation: NONREACTIVE

## 2021-12-05 LAB — HEPATITIS C ANTIBODY: Hepatitis C Ab: NONREACTIVE

## 2021-12-21 ENCOUNTER — Encounter: Payer: Self-pay | Admitting: Family

## 2022-02-15 IMAGING — MG DIGITAL SCREENING BILAT W/ TOMO W/ CAD
6 of 12 series · 6 of 36 positions shown · non-contrast
Comparison: Previous exam(s).

CLINICAL DATA: Screening.

EXAM:
DIGITAL SCREENING BILATERAL MAMMOGRAM WITH TOMO AND CAD

[R CC synth-2D]
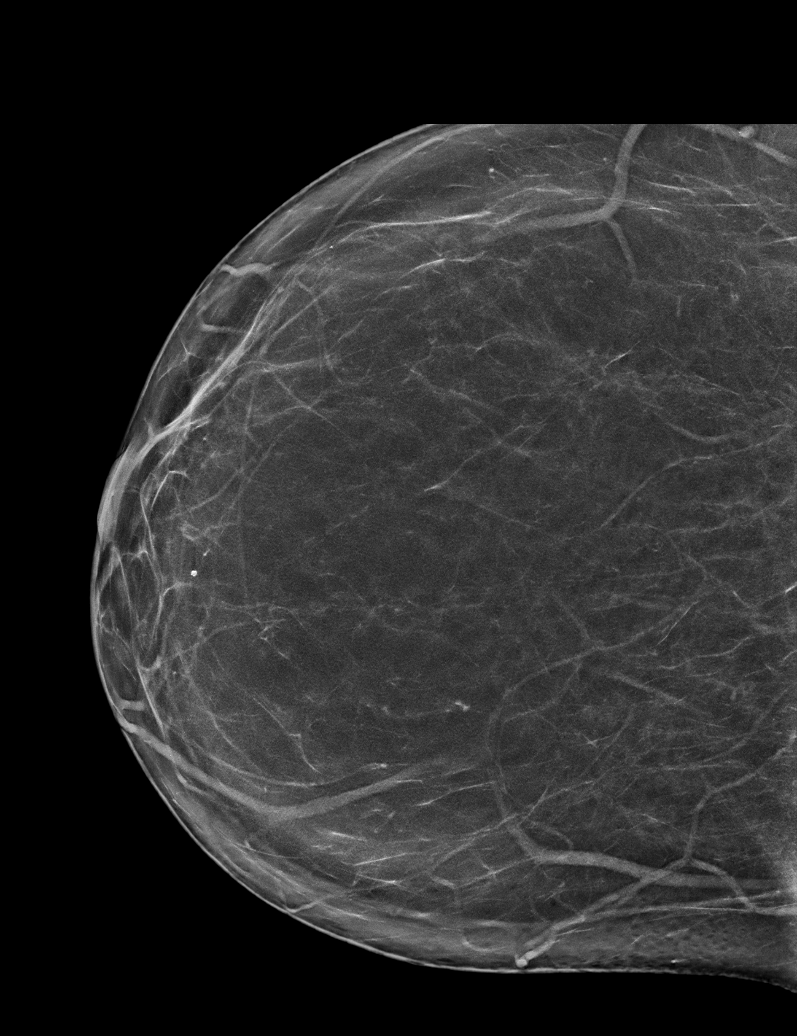

[L CC synth-2D]
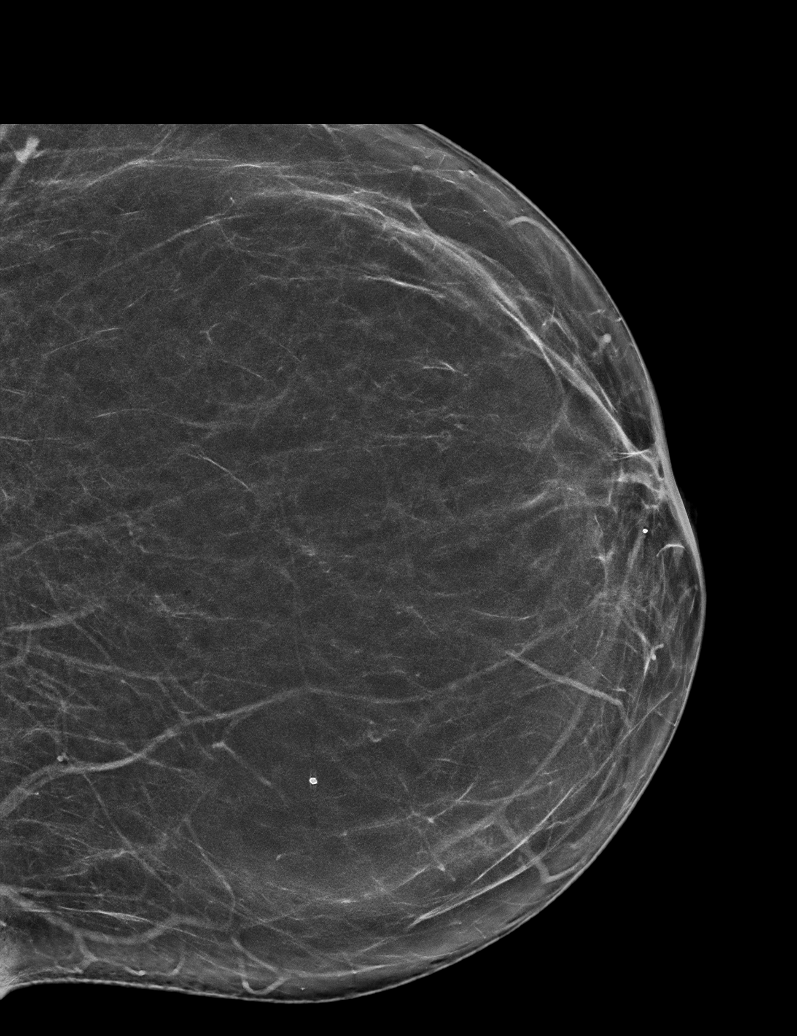

[L MLO synth-2D (1 of 2)]
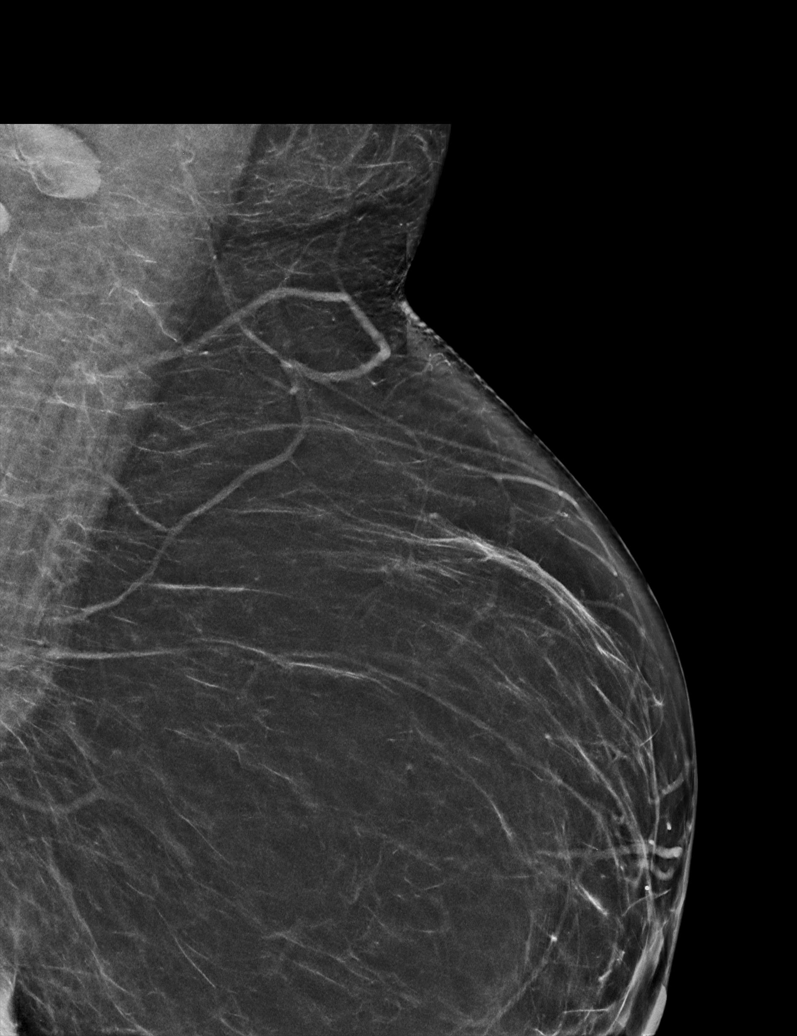

[L MLO synth-2D (2 of 2)]
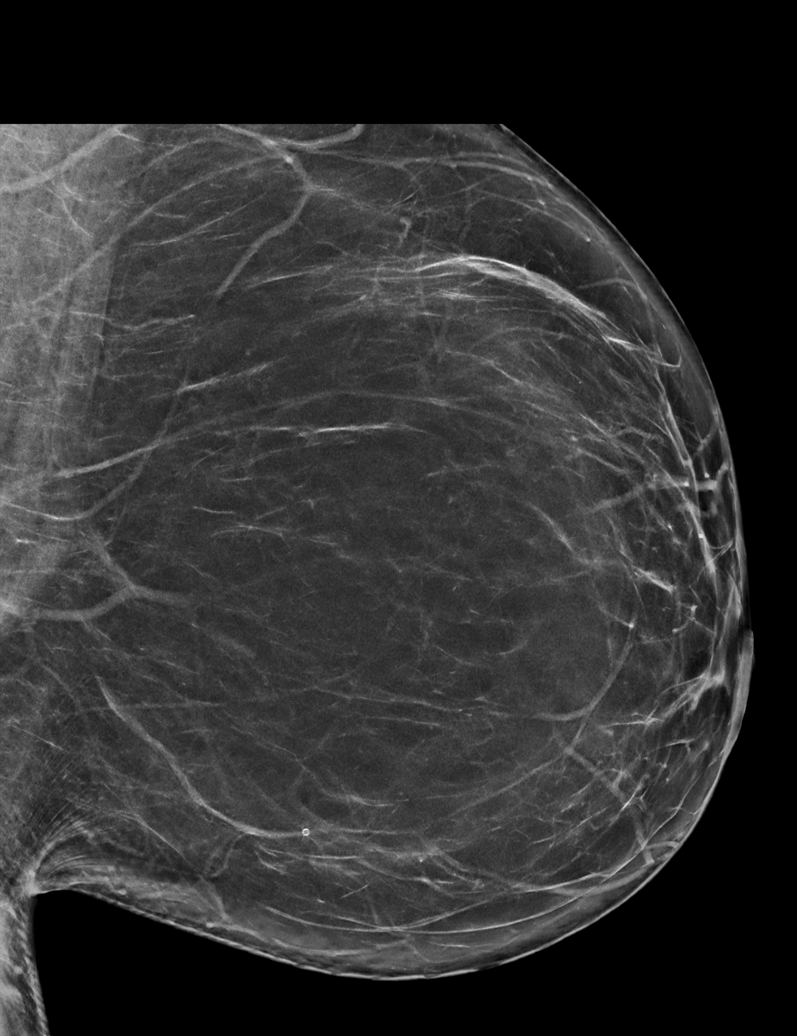

[R MLO synth-2D (1 of 2)]
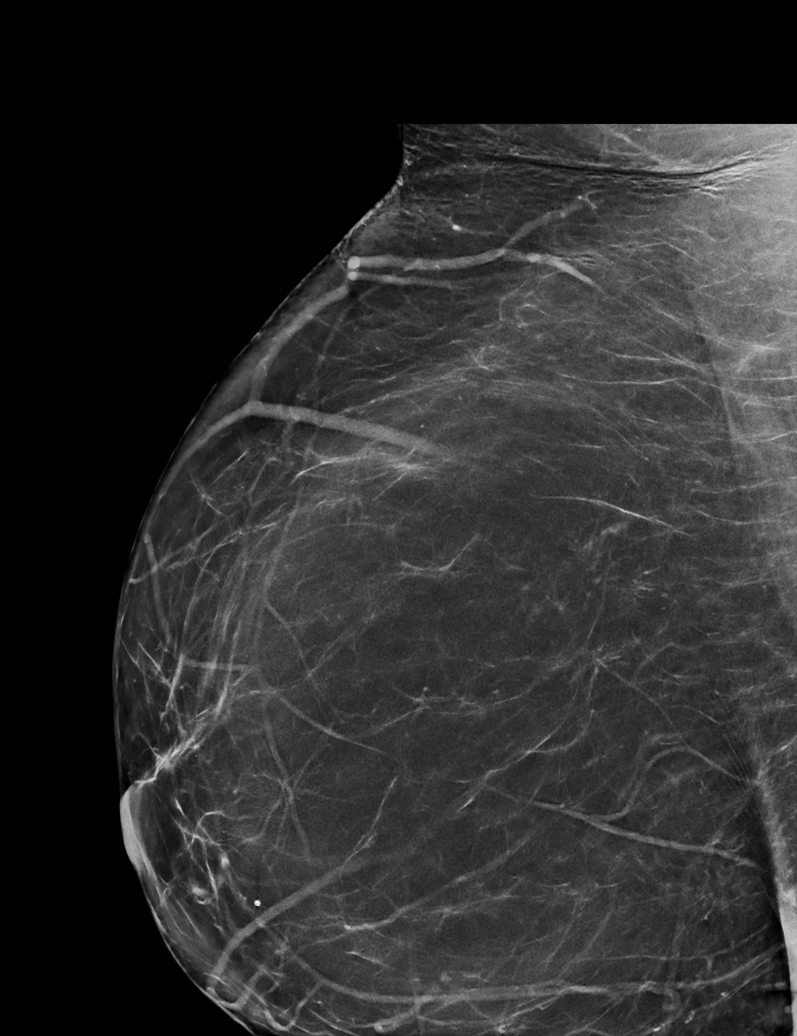

[R MLO synth-2D (2 of 2)]
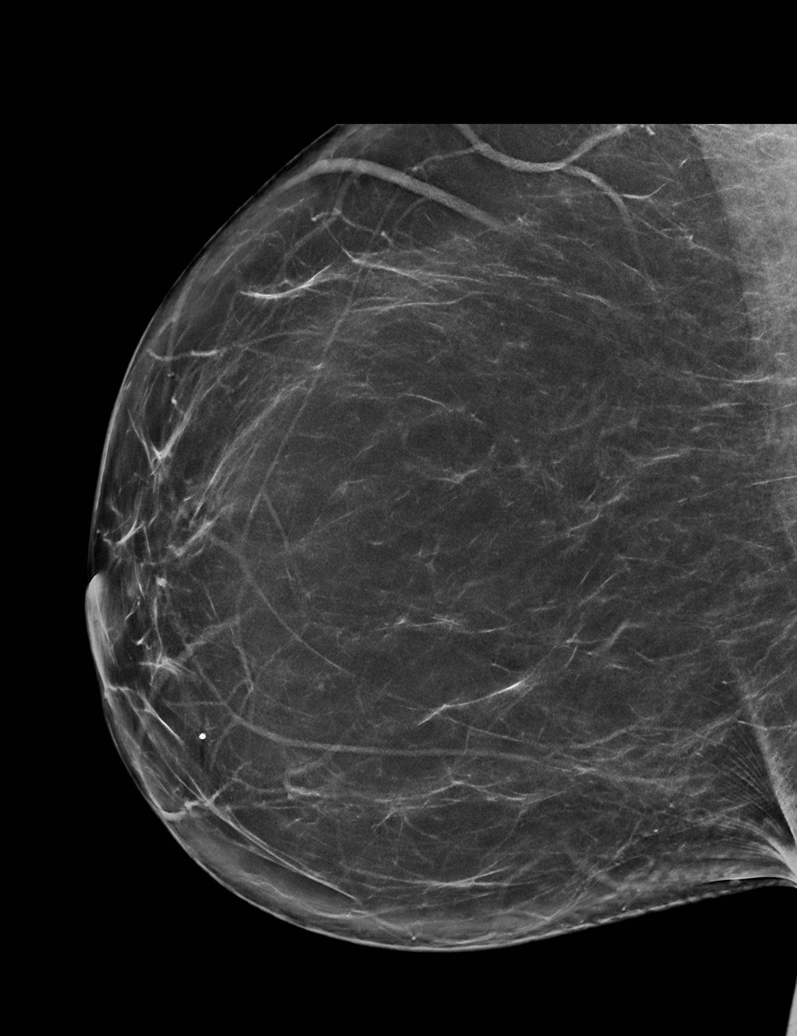

[6 of 36 positions shown; findings below may reference images not displayed]

ACR Breast Density Category b: There are scattered areas of
fibroglandular density.
FINDINGS: There are no findings suspicious for malignancy. Images were
processed with CAD.
IMPRESSION: No mammographic evidence of malignancy. A result letter of this
screening mammogram will be mailed directly to the patient.

RECOMMENDATION:
Screening mammogram in one year. (Code:[id number]-775)

BI-RADS CATEGORY  1: Negative.

## 2022-02-18 ENCOUNTER — Telehealth: Payer: Self-pay | Admitting: Family

## 2022-02-18 NOTE — Telephone Encounter (Signed)
Records release faxed 

## 2022-02-18 NOTE — Telephone Encounter (Signed)
Please call to request mamm/pap from [facility].

## 2022-03-08 ENCOUNTER — Ambulatory Visit: Payer: Self-pay | Admitting: Family

## 2022-04-05 ENCOUNTER — Ambulatory Visit: Payer: Self-pay | Admitting: Family

## 2022-04-11 ENCOUNTER — Ambulatory Visit (INDEPENDENT_AMBULATORY_CARE_PROVIDER_SITE_OTHER): Payer: 59 | Admitting: Family

## 2022-04-11 VITALS — BP 107/67 | HR 80 | Temp 98.0°F | Resp 16 | Wt 213.0 lb

## 2022-04-11 DIAGNOSIS — F419 Anxiety disorder, unspecified: Secondary | ICD-10-CM | POA: Diagnosis not present

## 2022-04-11 DIAGNOSIS — I10 Essential (primary) hypertension: Secondary | ICD-10-CM | POA: Diagnosis not present

## 2022-04-11 DIAGNOSIS — E119 Type 2 diabetes mellitus without complications: Secondary | ICD-10-CM | POA: Diagnosis not present

## 2022-04-11 DIAGNOSIS — E785 Hyperlipidemia, unspecified: Secondary | ICD-10-CM

## 2022-04-11 DIAGNOSIS — B009 Herpesviral infection, unspecified: Secondary | ICD-10-CM

## 2022-04-11 DIAGNOSIS — T7840XA Allergy, unspecified, initial encounter: Secondary | ICD-10-CM

## 2022-04-11 LAB — BASIC METABOLIC PANEL
BUN: 22 mg/dL (ref 6–23)
CO2: 29 mEq/L (ref 19–32)
Calcium: 9.7 mg/dL (ref 8.4–10.5)
Chloride: 99 mEq/L (ref 96–112)
Creatinine, Ser: 0.68 mg/dL (ref 0.40–1.20)
GFR: 95.64 mL/min (ref >60.00)
Glucose, Bld: 110 mg/dL — ABNORMAL HIGH (ref 70–99)
Potassium: 3.9 mEq/L (ref 3.5–5.1)
Sodium: 139 mEq/L (ref 135–145)

## 2022-04-11 LAB — HEMOGLOBIN A1C: Hgb A1c MFr Bld: 6.2 % (ref 4.6–6.5)

## 2022-04-11 MED ORDER — TELMISARTAN-HCTZ 80-25 MG PO TABS: 1.0000 | ORAL_TABLET | Freq: Every day | ORAL | 1 refills | Status: DC

## 2022-04-11 MED ORDER — EZETIMIBE 10 MG PO TABS: 10.0000 mg | ORAL_TABLET | Freq: Every day | ORAL | 1 refills | Status: DC

## 2022-04-11 MED ORDER — VENLAFAXINE HCL ER 150 MG PO CP24: 150.0000 mg | ORAL_CAPSULE | Freq: Every day | ORAL | 1 refills | Status: DC

## 2022-04-11 MED ORDER — PRAVASTATIN SODIUM 40 MG PO TABS: 40.0000 mg | ORAL_TABLET | Freq: Every day | ORAL | 1 refills | Status: DC

## 2022-04-11 MED ORDER — SYNJARDY XR 25-1000 MG PO TB24: 1.0000 | ORAL_TABLET | Freq: Every day | ORAL | 1 refills | Status: DC

## 2022-04-11 NOTE — Assessment & Plan Note (Addendum)
BP Readings from Last 3 Encounters:  04/11/22 107/67  12/04/21 116/74  10/16/21 130/64   BP at goal on micardis hct. Continue same.

## 2022-04-11 NOTE — Progress Notes (Signed)
Subjective:     Patient ID: Brittany Reyes, female    DOB: 29-Mar-1965, 57 y.o.   MRN: [id number]  Chief Complaint  Patient presents with   Hypertension    Here for follow up   Diabetes    Here for follow up    Hypertension  Diabetes   Patient is in today for follow up.   HTN- maintained on micardis HCT BP Readings from Last 3 Encounters:  04/11/22 107/67  12/04/21 116/74  10/16/21 130/64   DM2-  Lab Results  Component Value Date   HGBA1C 6.3 10/16/2021   Lab Results  Component Value Date   MICROALBUR <0.7 10/16/2021   LDLCALC 70 12/04/2021   CREATININE 0.70 10/16/2021   Hyperlipidemia-  Lab Results  Component Value Date   CHOL 139 12/04/2021   HDL 43.90 12/04/2021   LDLCALC 70 12/04/2021   TRIG 123.0 12/04/2021   CHOLHDL 3 12/04/2021   Herpes- no recent oubreaks.    Has noticed some spring allergies.   Declines shingrix today  Health Maintenance Due  Topic Date Due   Zoster Vaccines- Shingrix (1 of 2) Never done   COVID-19 Vaccine (3 - 2023-24 season) 09/14/2021    Past Medical History:  Diagnosis Date   Allergy    Anxiety    Diabetes mellitus without complication ([place])    Hyperlipidemia    Hypertension     Past Surgical History:  Procedure Laterality Date   DILATION AND CURETTAGE OF UTERUS     1998   HERNIA REPAIR     age 71 and 58    Family History  Problem Relation Age of Onset   Dementia Mother    Heart attack Father    Valvular heart disease Sister        Mitral valve replacement   Suicidality Brother    Dementia Maternal Grandmother    CVA Maternal Grandfather    Breast cancer Paternal Grandmother    CVA Paternal Grandfather    Colon cancer Neg Hx    Esophageal cancer Neg Hx    Rectal cancer Neg Hx    Stomach cancer Neg Hx     Social History   Socioeconomic History   Marital status: Married    Spouse name: Not on file   Number of children: Not on file   Years of education: Not on file   Highest education level:  Not on file  Occupational History   Not on file  Tobacco Use   Smoking status: Never   Smokeless tobacco: Never  Vaping Use   Vaping Use: Never used  Substance and Sexual Activity   Alcohol use: Yes    Comment: socially   Drug use: Never   Sexual activity: Yes    Partners: Male  Other Topics Concern   Not on file  Social History Narrative   Married   2 children   1 grandchild   Son in [place]   Daughter locally   Works as an Optometrist   Enjoys reading, spending time with friends.    Has 1 dog   Completed bachelors degree   Social Determinants of Radio broadcast assistant Strain: Not on file  Food Insecurity: Not on file  Transportation Needs: Not on file  Physical Activity: Not on file  Stress: Not on file  Social Connections: Not on file  Intimate Partner Violence: Not on file    Outpatient Medications Prior to Visit  Medication Sig Dispense Refill   amoxicillin (  AMOXIL) 500 MG capsule Take 500 mg by mouth 3 (three) times daily.     famciclovir (FAMVIR) 500 MG tablet Take 2 tabs by mouth now and 2 tabs in 12 hours at start of outbreak. 12 tablet 4   loratadine (CLARITIN) 10 MG tablet Take by mouth daily.     Empagliflozin-metFORMIN HCl ER (SYNJARDY XR) 25-1000 MG TB24 Take 1 tablet by mouth daily. 90 tablet 1   ezetimibe (ZETIA) 10 MG tablet Take 1 tablet (10 mg total) by mouth daily. 90 tablet 1   pravastatin (PRAVACHOL) 40 MG tablet Take 1 tablet (40 mg total) by mouth daily. 90 tablet 1   telmisartan-hydrochlorothiazide (MICARDIS HCT) 80-25 MG tablet Take 1 tablet by mouth daily. 90 tablet 1   venlafaxine XR (EFFEXOR-XR) 150 MG 24 hr capsule Take 1 capsule (150 mg total) by mouth daily. 90 capsule 1   No facility-administered medications prior to visit.    Allergies  Allergen Reactions   Ace Inhibitors Other (See Comments)    Cough Cough     ROS See HPI    Objective:    Physical Exam Constitutional:      General: She is not in acute  distress.    Appearance: Normal appearance. She is well-developed.  HENT:     Head: Normocephalic and atraumatic.     Right Ear: External ear normal.     Left Ear: External ear normal.  Eyes:     General: No scleral icterus. Neck:     Thyroid: No thyromegaly.  Cardiovascular:     Rate and Rhythm: Normal rate and regular rhythm.     Heart sounds: Normal heart sounds. No murmur heard. Pulmonary:     Effort: Pulmonary effort is normal. No respiratory distress.     Breath sounds: Normal breath sounds. No wheezing.  Musculoskeletal:     Cervical back: Neck supple.  Skin:    General: Skin is warm and dry.  Neurological:     Mental Status: She is alert and oriented to person, place, and time.  Psychiatric:        Mood and Affect: Mood normal.        Behavior: Behavior normal.        Thought Content: Thought content normal.        Judgment: Judgment normal.    Diabetic Foot Exam - Simple   Simple Foot Form Diabetic Foot exam was performed with the following findings: Yes 04/11/2022  8:45 AM  Visual Inspection No deformities, no ulcerations, no other skin breakdown bilaterally: Yes Sensation Testing Intact to touch and monofilament testing bilaterally: Yes Pulse Check Posterior Tibialis and Dorsalis pulse intact bilaterally: Yes Comments      BP 107/67 (BP Location: Right Arm, Patient Position: Sitting, Cuff Size: Large)   Pulse 80   Temp 98 F (36.7 C) (Oral)   Resp 16   Wt 213 lb (96.6 kg)   SpO2 97%   BMI 38.96 kg/m  Wt Readings from Last 3 Encounters:  04/11/22 213 lb (96.6 kg)  12/04/21 205 lb (93 kg)  10/16/21 210 lb (95.3 kg)       Assessment & Plan:   Problem List Items Addressed This Visit       Unprioritized   Hypertension    BP Readings from Last 3 Encounters:  04/11/22 107/67  12/04/21 116/74  10/16/21 130/64  BP at goal on micardis hct. Continue same.        Relevant Medications   pravastatin (PRAVACHOL) 40  MG tablet    telmisartan-hydrochlorothiazide (MICARDIS HCT) 80-25 MG tablet   ezetimibe (ZETIA) 10 MG tablet   Hyperlipidemia - Primary    At goal on pravastatin/zetia.  Continue same.       Relevant Medications   pravastatin (PRAVACHOL) 40 MG tablet   telmisartan-hydrochlorothiazide (MICARDIS HCT) 80-25 MG tablet   ezetimibe (ZETIA) 10 MG tablet   genital herpes    Stable, keeps famvir on hand for prn use.      Diabetes mellitus without complication ([place])    At goal continue synjardy XR.        Relevant Medications   pravastatin (PRAVACHOL) 40 MG tablet   telmisartan-hydrochlorothiazide (MICARDIS HCT) 80-25 MG tablet   Empagliflozin-metFORMIN HCl ER (SYNJARDY XR) 25-1000 MG TB24   Other Relevant Orders   HgB 123456   Basic Metabolic Panel (BMET)   Anxiety    Reports that she is well controlled.        Relevant Medications   venlafaxine XR (EFFEXOR-XR) 150 MG 24 hr capsule   Allergy    Reports stable on claritin and flonase.       I am having Myasia L. Neyland maintain her loratadine, famciclovir, amoxicillin, pravastatin, venlafaxine XR, telmisartan-hydrochlorothiazide, ezetimibe, and Synjardy XR.  Meds ordered this encounter  Medications   pravastatin (PRAVACHOL) 40 MG tablet    Sig: Take 1 tablet (40 mg total) by mouth daily.    Dispense:  90 tablet    Refill:  1    Order Specific Question:   Supervising Provider    Answer:   Penni Homans A [[id number]]   venlafaxine XR (EFFEXOR-XR) 150 MG 24 hr capsule    Sig: Take 1 capsule (150 mg total) by mouth daily.    Dispense:  90 capsule    Refill:  1    Order Specific Question:   Supervising Provider    Answer:   Penni Homans A [[id number]]   telmisartan-hydrochlorothiazide (MICARDIS HCT) 80-25 MG tablet    Sig: Take 1 tablet by mouth daily.    Dispense:  90 tablet    Refill:  1    Order Specific Question:   Supervising Provider    Answer:   Penni Homans A [[id number]]   ezetimibe (ZETIA) 10 MG tablet    Sig: Take 1 tablet (10 mg total) by  mouth daily.    Dispense:  90 tablet    Refill:  1    Order Specific Question:   Supervising Provider    Answer:   Penni Homans A [[id number]]   Empagliflozin-metFORMIN HCl ER (SYNJARDY XR) 25-1000 MG TB24    Sig: Take 1 tablet by mouth daily.    Dispense:  90 tablet    Refill:  1    Order Specific Question:   Supervising Provider    Answer:   Penni Homans A [[id number]]

## 2022-04-11 NOTE — Assessment & Plan Note (Addendum)
At goal on pravastatin/zetia.  Continue same.

## 2022-04-11 NOTE — Assessment & Plan Note (Signed)
Reports that she is well controlled.

## 2022-04-11 NOTE — Assessment & Plan Note (Signed)
Stable, keeps famvir on hand for prn use.

## 2022-04-11 NOTE — Assessment & Plan Note (Signed)
At goal continue synjardy XR.

## 2022-04-11 NOTE — Assessment & Plan Note (Signed)
Reports stable on claritin and flonase.

## 2022-05-02 LAB — HM MAMMOGRAPHY

## 2022-07-22 ENCOUNTER — Telehealth: Payer: Self-pay

## 2022-07-22 NOTE — Telephone Encounter (Signed)
PA initiated via Covermymeds; KEY: [id number]. Awaiting determination.

## 2022-07-29 MED ORDER — TELMISARTAN 80 MG PO TABS: 80.0000 mg | ORAL_TABLET | Freq: Every day | ORAL | 1 refills | Status: DC

## 2022-07-29 MED ORDER — HYDROCHLOROTHIAZIDE 25 MG PO TABS: 25.0000 mg | ORAL_TABLET | Freq: Every day | ORAL | 1 refills | Status: DC

## 2022-07-29 NOTE — Telephone Encounter (Signed)
Patient notified of this information and she will try the medications separate

## 2022-07-29 NOTE — Telephone Encounter (Signed)
PA denied.   Per your health plan's criteria, this drug is covered if you meet the following: (1) You cannot use (experienced intolerance, for example: allergy to excipient) a covered (formulary) generic drug with the same active ingredient: Telmisartan, Hydrochlorothiazide. (2) You have tried and failed at least two more covered (formulary) generic drugs within the same drug class (if only one covered generic drug within the same therapeutic class is available, you must have tried the one covered generic drug within the same therapeutic class and one additional covered generic drug; if there are no covered generic drugs within the same therapeutic class, you must have tried or cannot use two covered generic drugs). Covered drugs include Candesartan, Irbesartan, Losartan, Olmesartan, Valsartan. (3) Your doctor provides medical records (for example: chart notes) showing there are clinical reasons why the requested drug would work for your condition and covered (formulary) generic drug would not. The information provided does not show that you meet the criteria listed above. Please speak with your doctor about your choices. Reviewed by: Erick AlleyPh.

## 2022-07-29 NOTE — Telephone Encounter (Signed)
Please advise pt that her insurance will not cover micardis hydrochlorothiazide.  Instead I broke the med into the two components telmisartan and hydrochlorothiazide.  Hopefully her insurance will cover that.

## 2022-09-19 ENCOUNTER — Other Ambulatory Visit: Payer: Self-pay | Admitting: Family

## 2022-10-16 ENCOUNTER — Other Ambulatory Visit: Payer: Self-pay | Admitting: Family

## 2022-10-22 LAB — HM DIABETES EYE EXAM

## 2022-12-05 ENCOUNTER — Other Ambulatory Visit: Payer: Self-pay | Admitting: Family

## 2023-01-15 ENCOUNTER — Other Ambulatory Visit: Payer: Self-pay | Admitting: Family

## 2023-01-29 ENCOUNTER — Other Ambulatory Visit: Payer: Self-pay | Admitting: Family

## 2023-01-29 NOTE — Telephone Encounter (Signed)
 Please contact pt to schedule a follow up visit.

## 2023-02-19 ENCOUNTER — Ambulatory Visit: Payer: 59 | Admitting: Family

## 2023-02-20 ENCOUNTER — Other Ambulatory Visit: Payer: Self-pay | Admitting: Family

## 2023-02-28 ENCOUNTER — Encounter: Payer: Self-pay | Admitting: Family

## 2023-02-28 ENCOUNTER — Ambulatory Visit (INDEPENDENT_AMBULATORY_CARE_PROVIDER_SITE_OTHER): Payer: BC Managed Care – PPO | Admitting: Family

## 2023-02-28 ENCOUNTER — Telehealth: Payer: Self-pay | Admitting: Family

## 2023-02-28 VITALS — BP 129/67 | HR 78 | Temp 98.4°F | Resp 16 | Ht 63.0 in | Wt 219.0 lb

## 2023-02-28 DIAGNOSIS — B009 Herpesviral infection, unspecified: Secondary | ICD-10-CM

## 2023-02-28 DIAGNOSIS — F419 Anxiety disorder, unspecified: Secondary | ICD-10-CM

## 2023-02-28 DIAGNOSIS — I10 Essential (primary) hypertension: Secondary | ICD-10-CM | POA: Diagnosis not present

## 2023-02-28 DIAGNOSIS — E785 Hyperlipidemia, unspecified: Secondary | ICD-10-CM | POA: Diagnosis not present

## 2023-02-28 DIAGNOSIS — Z7984 Long term (current) use of oral hypoglycemic drugs: Secondary | ICD-10-CM

## 2023-02-28 DIAGNOSIS — E119 Type 2 diabetes mellitus without complications: Secondary | ICD-10-CM | POA: Diagnosis not present

## 2023-02-28 LAB — COMPREHENSIVE METABOLIC PANEL
ALT: 14 U/L (ref 0–35)
AST: 15 U/L (ref 0–37)
Albumin: 4.7 g/dL (ref 3.5–5.2)
Alkaline Phosphatase: 48 U/L (ref 39–117)
BUN: 26 mg/dL — ABNORMAL HIGH (ref 6–23)
CO2: 29 meq/L (ref 19–32)
Calcium: 9.4 mg/dL (ref 8.4–10.5)
Chloride: 97 meq/L (ref 96–112)
Creatinine, Ser: 0.67 mg/dL (ref 0.40–1.20)
GFR: 95.39 mL/min (ref >60.00)
Glucose, Bld: 104 mg/dL — ABNORMAL HIGH (ref 70–99)
Potassium: 4 meq/L (ref 3.5–5.1)
Sodium: 136 meq/L (ref 135–145)
Total Bilirubin: 0.7 mg/dL (ref 0.2–1.2)
Total Protein: 7.2 g/dL (ref 6.0–8.3)

## 2023-02-28 LAB — LIPID PANEL
Cholesterol: 125 mg/dL (ref 0–200)
HDL: 38 mg/dL — ABNORMAL LOW (ref >39.00)
LDL Cholesterol: 68 mg/dL (ref 0–99)
NonHDL: 86.95
Total CHOL/HDL Ratio: 3
Triglycerides: 93 mg/dL (ref 0.0–149.0)
VLDL: 18.6 mg/dL (ref 0.0–40.0)

## 2023-02-28 LAB — MICROALBUMIN / CREATININE URINE RATIO
Creatinine,U: 23.1 mg/dL
Microalb Creat Ratio: 30.3 mg/g — ABNORMAL HIGH (ref 0.0–30.0)
Microalb, Ur: <0.7 mg/dL (ref 0.0–1.9)

## 2023-02-28 LAB — HEMOGLOBIN A1C: Hgb A1c MFr Bld: 6.8 % — ABNORMAL HIGH (ref 4.6–6.5)

## 2023-02-28 MED ORDER — FAMCICLOVIR 500 MG PO TABS: ORAL_TABLET | ORAL | 4 refills | Status: DC

## 2023-02-28 MED ORDER — VENLAFAXINE HCL ER 150 MG PO CP24: 150.0000 mg | ORAL_CAPSULE | Freq: Every day | ORAL | 1 refills | Status: DC

## 2023-02-28 MED ORDER — EZETIMIBE 10 MG PO TABS: 10.0000 mg | ORAL_TABLET | Freq: Every day | ORAL | 1 refills | Status: DC

## 2023-02-28 MED ORDER — PRAVASTATIN SODIUM 40 MG PO TABS: 40.0000 mg | ORAL_TABLET | Freq: Every day | ORAL | 1 refills | Status: DC

## 2023-02-28 MED ORDER — SYNJARDY XR 25-1000 MG PO TB24: 1.0000 | ORAL_TABLET | Freq: Every day | ORAL | 1 refills | Status: DC

## 2023-02-28 MED ORDER — TELMISARTAN-HCTZ 80-25 MG PO TABS
1.0000 | ORAL_TABLET | Freq: Every day | ORAL | 1 refills | Status: DC
Start: 2023-02-28 — End: 2023-08-28

## 2023-02-28 NOTE — Telephone Encounter (Signed)
[facility] to request [facility] of last mammo.

## 2023-02-28 NOTE — Assessment & Plan Note (Signed)
Stable on effexor XR 150 mg.

## 2023-02-28 NOTE — Progress Notes (Signed)
Subjective:     Patient ID: Brittany Reyes, female    DOB: 1965-06-20, 58 y.o.   MRN: [id number]  Chief Complaint  Patient presents with   Hypertension    Here for follow up    Hypertension    Discussed the use of AI scribe software for clinical note transcription with the patient, who gave verbal consent to proceed.  History of Present Illness        Patient is a 58 yr old female who presents today for follow up.  Hyperlipidemia- on pravastatin and zetia. Lab Results  Component Value Date   CHOL 139 12/04/2021   HDL 43.90 12/04/2021   LDLCALC 70 12/04/2021   TRIG 123.0 12/04/2021   CHOLHDL 3 12/04/2021   HTN- on hydrochlorothiazide, micardis.  BP Readings from Last 3 Encounters:  02/28/23 129/67  04/11/22 107/67  12/04/21 116/74   Anxiety- maintained on effexor xr 150mg .   DM2- maintained on synjardy.  Lab Results  Component Value Date   HGBA1C 6.2 04/11/2022   HGBA1C 6.3 10/16/2021   Lab Results  Component Value Date   MICROALBUR <0.7 10/16/2021   LDLCALC 70 12/04/2021   CREATININE 0.68 04/11/2022         Health Maintenance Due  Topic Date Due   Zoster Vaccines- Shingrix (1 of 2) Never done   MAMMOGRAM  07/26/2022   COVID-19 Vaccine (3 - 2024-25 season) 09/15/2022   HEMOGLOBIN A1C  10/12/2022   Diabetic kidney evaluation - Urine ACR  10/17/2022    Past Medical History:  Diagnosis Date   Allergy    Anxiety    Diabetes mellitus without complication (HCC)    Hyperlipidemia    Hypertension     Past Surgical History:  Procedure Laterality Date   DILATION AND CURETTAGE OF UTERUS     1998   HERNIA REPAIR     age 41 and 2    Family History  Problem Relation Age of Onset   Dementia Mother    Heart attack Father    Valvular heart disease Sister        Mitral valve replacement   Suicidality Brother    Dementia Maternal Grandmother    CVA Maternal Grandfather    Breast cancer Paternal Grandmother    CVA Paternal Grandfather    Colon  cancer Neg Hx    Esophageal cancer Neg Hx    Rectal cancer Neg Hx    Stomach cancer Neg Hx     Social History   Socioeconomic History   Marital status: Married    Spouse name: Not on file   Number of children: Not on file   Years of education: Not on file   Highest education level: Bachelor's degree (e.g., BA, AB, BS)  Occupational History   Not on file  Tobacco Use   Smoking status: Never   Smokeless tobacco: Never  Vaping Use   Vaping status: Never Used  Substance and Sexual Activity   Alcohol use: Yes    Comment: socially   Drug use: Never   Sexual activity: Yes    Partners: Male  Other Topics Concern   Not on file  Social History Narrative   Married   2 children   1 grandchild   Son in [place]   Daughter locally   Works as an Airline pilot   Enjoys reading, spending time with friends.    Has 1 dog   Completed bachelors degree   Social Drivers of Dispensing optician  Resource Strain: Patient Declined (02/27/2023)   Overall Financial Resource Strain (CARDIA)    Difficulty of Paying Living Expenses: Patient declined  Food Insecurity: Patient Declined (02/27/2023)   Hunger Vital Sign    Worried About Running Out of Food in the Last Year: Patient declined    Ran Out of Food in the Last Year: Patient declined  Transportation Needs: No Transportation Needs (02/27/2023)   PRAPARE - Administrator, Civil Service (Medical): No    Lack of Transportation (Non-Medical): No  Physical Activity: Unknown (02/27/2023)   Exercise Vital Sign    Days of Exercise per Week: Patient declined    Minutes of Exercise per Session: Not on file  Stress: No Stress Concern Present (02/27/2023)   [employer] of Occupational Health - Occupational Stress Questionnaire    Feeling of Stress : Not at all  Social Connections: Socially Integrated (02/27/2023)   Social Connection and Isolation Panel [NHANES]    Frequency of Communication with Friends and Family: More than three times  a week    Frequency of Social Gatherings with Friends and Family: Once a week    Attends Religious Services: More than 4 times per year    Active Member of [employer] or Organizations: Yes    Attends Banker Meetings: Patient declined    Marital Status: Married  Catering manager Violence: Not on file    Outpatient Medications Prior to Visit  Medication Sig Dispense Refill   loratadine (CLARITIN) 10 MG tablet Take by mouth daily.     amoxicillin (AMOXIL) 500 MG capsule Take 500 mg by mouth 3 (three) times daily.     ezetimibe (ZETIA) 10 MG tablet Take 1 tablet (10 mg total) by mouth daily. 90 tablet 1   famciclovir (FAMVIR) 500 MG tablet TAKE 2 TABS BY MOUTH NOW AND 2 TABS IN 12 HOURS AT START OF OUTBREAK. 12 tablet 4   hydrochlorothiazide (HYDRODIURIL) 25 MG tablet Take 1 tablet (25 mg total) by mouth daily. 30 tablet 0   pravastatin (PRAVACHOL) 40 MG tablet Take 1 tablet (40 mg total) by mouth daily. 90 tablet 1   SYNJARDY XR 25-1000 MG TB24 TAKE 1 TABLET BY MOUTH EVERY DAY 90 tablet 1   telmisartan (MICARDIS) 80 MG tablet Take 1 tablet (80 mg total) by mouth daily. 30 tablet 0   venlafaxine XR (EFFEXOR-XR) 150 MG 24 hr capsule TAKE 1 CAPSULE BY MOUTH DAILY WITH BREAKFAST. 90 capsule 0   No facility-administered medications prior to visit.    Allergies  Allergen Reactions   Ace Inhibitors Other (See Comments)    Cough Cough     ROS    See HPI Objective:    Physical Exam Constitutional:      General: She is not in acute distress.    Appearance: Normal appearance. She is well-developed.  HENT:     Head: Normocephalic and atraumatic.     Right Ear: External ear normal.     Left Ear: External ear normal.  Eyes:     General: No scleral icterus. Neck:     Thyroid: No thyromegaly.  Cardiovascular:     Rate and Rhythm: Normal rate and regular rhythm.     Heart sounds: Normal heart sounds. No murmur heard. Pulmonary:     Effort: Pulmonary effort is normal. No  respiratory distress.     Breath sounds: Normal breath sounds. No wheezing.  Musculoskeletal:     Cervical back: Neck supple.  Skin:    General:  Skin is warm and dry.  Neurological:     Mental Status: She is alert and oriented to person, place, and time.  Psychiatric:        Mood and Affect: Mood normal.        Behavior: Behavior normal.        Thought Content: Thought content normal.        Judgment: Judgment normal.      BP 129/67 (BP Location: Right Arm, Patient Position: Sitting)   Pulse 78   Temp 98.4 F (36.9 C) (Oral)   Resp 16   Ht 5\' 3"  (1.6 m)   Wt 219 lb (99.3 kg)   SpO2 98%   BMI 38.79 kg/m  Wt Readings from Last 3 Encounters:  02/28/23 219 lb (99.3 kg)  04/11/22 213 lb (96.6 kg)  12/04/21 205 lb (93 kg)       Assessment & Plan:   Problem List Items Addressed This Visit       Unprioritized   Hypertension - Primary   BP stable on hydrochlorothiazide and micardis.  Will see if her new insurance will cover the combo pill that she was on previously.       Relevant Medications   telmisartan-hydrochlorothiazide (MICARDIS HCT) 80-25 MG tablet   pravastatin (PRAVACHOL) 40 MG tablet   ezetimibe (ZETIA) 10 MG tablet   Hyperlipidemia   Tolerating pravastatin and zetia. Update lipid panel.       Relevant Medications   telmisartan-hydrochlorothiazide (MICARDIS HCT) 80-25 MG tablet   pravastatin (PRAVACHOL) 40 MG tablet   ezetimibe (ZETIA) 10 MG tablet   Other Relevant Orders   Lipid panel   genital herpes   No recent outbreaks.  Uses famvir PRN with good success.       Relevant Medications   famciclovir (FAMVIR) 500 MG tablet   Diabetes mellitus without complication (HCC)   Has been stable, due for follow up A1C.  Continue synjardy.       Relevant Medications   telmisartan-hydrochlorothiazide (MICARDIS HCT) 80-25 MG tablet   pravastatin (PRAVACHOL) 40 MG tablet   Empagliflozin-metFORMIN HCl ER (SYNJARDY XR) 25-1000 MG TB24   Other Relevant Orders    HgB A1c   Urine Microalbumin w/creat. ratio   Comp Met (CMET)   Anxiety   Stable on effexor XR 150 mg.        Relevant Medications   venlafaxine XR (EFFEXOR-XR) 150 MG 24 hr capsule    I have discontinued Deaven L. Parlow's amoxicillin, hydrochlorothiazide, and telmisartan. I have also changed her venlafaxine XR and Synjardy XR. Additionally, I am having her start on telmisartan-hydrochlorothiazide. Lastly, I am having her maintain her loratadine, pravastatin, famciclovir, and ezetimibe.  Meds ordered this encounter  Medications   telmisartan-hydrochlorothiazide (MICARDIS HCT) 80-25 MG tablet    Sig: Take 1 tablet by mouth daily.    Dispense:  90 tablet    Refill:  1    Supervising Provider:   Danise Edge A [[id number]]   pravastatin (PRAVACHOL) 40 MG tablet    Sig: Take 1 tablet (40 mg total) by mouth daily.    Dispense:  90 tablet    Refill:  1    Supervising Provider:   Danise Edge A [[id number]]   venlafaxine XR (EFFEXOR-XR) 150 MG 24 hr capsule    Sig: Take 1 capsule (150 mg total) by mouth daily with breakfast.    Dispense:  90 capsule    Refill:  1    Supervising Provider:   Danise Edge  A [[id number]]   Empagliflozin-metFORMIN HCl ER (SYNJARDY XR) 25-1000 MG TB24    Sig: Take 1 tablet by mouth daily.    Dispense:  90 tablet    Refill:  1    Supervising Provider:   Danise Edge A [[id number]]   famciclovir (FAMVIR) 500 MG tablet    Sig: Take 2 tabs by mouth now and 2 tabs in 12 hours at start of outbreak.    Dispense:  12 tablet    Refill:  4    Supervising Provider:   Danise Edge A [[id number]]   ezetimibe (ZETIA) 10 MG tablet    Sig: Take 1 tablet (10 mg total) by mouth daily.    Dispense:  90 tablet    Refill:  1    Supervising Provider:   Danise Edge A [[id number]]

## 2023-02-28 NOTE — Assessment & Plan Note (Signed)
Tolerating pravastatin and zetia. Update lipid panel.

## 2023-02-28 NOTE — Assessment & Plan Note (Signed)
BP stable on hydrochlorothiazide and micardis.  Will see if her new insurance will cover the combo pill that she was on previously.

## 2023-02-28 NOTE — Assessment & Plan Note (Signed)
Has been stable, due for follow up A1C.  Continue synjardy.

## 2023-02-28 NOTE — Patient Instructions (Signed)
Please complete lab work prior to leaving.

## 2023-02-28 NOTE — Telephone Encounter (Signed)
Electronic request made

## 2023-02-28 NOTE — Assessment & Plan Note (Signed)
No recent outbreaks.  Uses famvir PRN with good success.

## 2023-03-07 ENCOUNTER — Telehealth: Payer: Self-pay

## 2023-03-07 MED ORDER — METFORMIN HCL 1000 MG PO TABS: 1000.0000 mg | ORAL_TABLET | Freq: Two times a day (BID) | ORAL | 1 refills | Status: DC

## 2023-03-07 NOTE — Telephone Encounter (Signed)
 Patient notified of changes, rx sent

## 2023-03-07 NOTE — Telephone Encounter (Signed)
 Copied from CRM [id number]. Topic: Clinical - Prescription Issue >> Mar 07, 2023  1:11 PM Gurney Maxin H wrote: Reason for CRM: Patient states insurance has changed and now Empagliflozin-metFORMIN HCl ER (SYNJARDY XR) 25-1000 MG TB24 is 1300 dollars for a 90 day supply. Patient states she can't afford that and is asking for a replacement.  Joyce Gross [phone]

## 2023-03-07 NOTE — Telephone Encounter (Signed)
 Stop synjardy. Start metformin 1000mg  bid.  Let me know if her blood sugar is running consistently >200 after she makes this change.

## 2023-03-21 ENCOUNTER — Ambulatory Visit: Payer: BC Managed Care – PPO

## 2023-06-25 ENCOUNTER — Ambulatory Visit: Payer: BC Managed Care – PPO | Admitting: Family

## 2023-08-07 ENCOUNTER — Ambulatory Visit: Admitting: Family

## 2023-08-28 ENCOUNTER — Other Ambulatory Visit: Payer: Self-pay | Admitting: Family

## 2023-08-28 DIAGNOSIS — E785 Hyperlipidemia, unspecified: Secondary | ICD-10-CM

## 2023-08-28 DIAGNOSIS — I10 Essential (primary) hypertension: Secondary | ICD-10-CM

## 2023-09-06 ENCOUNTER — Other Ambulatory Visit: Payer: Self-pay | Admitting: Family

## 2023-09-10 ENCOUNTER — Ambulatory Visit: Admitting: Family

## 2023-09-19 ENCOUNTER — Ambulatory Visit (INDEPENDENT_AMBULATORY_CARE_PROVIDER_SITE_OTHER): Admitting: Family

## 2023-09-19 VITALS — BP 123/72 | HR 84 | Temp 98.0°F | Resp 16 | Ht 63.0 in | Wt 197.0 lb

## 2023-09-19 DIAGNOSIS — E119 Type 2 diabetes mellitus without complications: Secondary | ICD-10-CM | POA: Diagnosis not present

## 2023-09-19 DIAGNOSIS — E785 Hyperlipidemia, unspecified: Secondary | ICD-10-CM

## 2023-09-19 DIAGNOSIS — Z23 Encounter for immunization: Secondary | ICD-10-CM

## 2023-09-19 DIAGNOSIS — F419 Anxiety disorder, unspecified: Secondary | ICD-10-CM

## 2023-09-19 DIAGNOSIS — Z7984 Long term (current) use of oral hypoglycemic drugs: Secondary | ICD-10-CM

## 2023-09-19 DIAGNOSIS — I10 Essential (primary) hypertension: Secondary | ICD-10-CM

## 2023-09-19 DIAGNOSIS — B009 Herpesviral infection, unspecified: Secondary | ICD-10-CM

## 2023-09-19 LAB — HEMOGLOBIN A1C: Hgb A1c MFr Bld: 6.2 % (ref 4.6–6.5)

## 2023-09-19 LAB — BASIC METABOLIC PANEL WITH GFR
BUN: 12 mg/dL (ref 6–23)
CO2: 27 meq/L (ref 19–32)
Calcium: 9.1 mg/dL (ref 8.4–10.5)
Chloride: 93 meq/L — ABNORMAL LOW (ref 96–112)
Creatinine, Ser: 0.54 mg/dL (ref 0.40–1.20)
GFR: 100.08 mL/min (ref >60.00)
Glucose, Bld: 90 mg/dL (ref 70–99)
Potassium: 3.9 meq/L (ref 3.5–5.1)
Sodium: 133 meq/L — ABNORMAL LOW (ref 135–145)

## 2023-09-19 MED ORDER — EZETIMIBE 10 MG PO TABS
10.0000 mg | ORAL_TABLET | Freq: Every day | ORAL | 0 refills | Status: AC
Start: 2023-09-19 — End: ?

## 2023-09-19 MED ORDER — METFORMIN HCL 1000 MG PO TABS: 1000.0000 mg | ORAL_TABLET | Freq: Two times a day (BID) | ORAL | 0 refills | Status: DC

## 2023-09-19 MED ORDER — VENLAFAXINE HCL ER 150 MG PO CP24
150.0000 mg | ORAL_CAPSULE | Freq: Every day | ORAL | 1 refills | Status: AC
Start: 2023-09-19 — End: ?

## 2023-09-19 MED ORDER — FAMCICLOVIR 500 MG PO TABS
ORAL_TABLET | ORAL | 4 refills | Status: DC
Start: 2023-09-19 — End: 2023-10-04

## 2023-09-19 MED ORDER — TELMISARTAN-HCTZ 80-25 MG PO TABS: 1.0000 | ORAL_TABLET | Freq: Every day | ORAL | 0 refills | Status: AC

## 2023-09-19 MED ORDER — PRAVASTATIN SODIUM 40 MG PO TABS: 40.0000 mg | ORAL_TABLET | Freq: Every day | ORAL | 0 refills | Status: AC

## 2023-09-19 NOTE — Assessment & Plan Note (Signed)
 She is managing her anxiety with effexor . We discussed the possibility of getting

## 2023-09-19 NOTE — Assessment & Plan Note (Signed)
 BP Readings from Last 3 Encounters:  09/19/23 123/72  02/28/23 129/67  04/11/22 107/67  Stable on micardis  HCT.

## 2023-09-19 NOTE — Patient Instructions (Signed)
 VISIT SUMMARY:  Today, we reviewed your medications and overall health, focusing on your diabetes, hypertension, cholesterol, anxiety, and general health maintenance. We also discussed the impact of your husband's cancer diagnosis on your stress levels and health.  YOUR PLAN:  TYPE 2 DIABETES MELLITUS: Your diabetes is currently managed with metformin . We are waiting for your A1c results to assess your blood sugar control. -Check your A1c today. -Recheck your A1c in 3 months. -We will consider alternative medications if your A1c is not under 7.  ESSENTIAL HYPERTENSION: Your blood pressure is well-controlled with Micardis  HCT. -Continue taking Micardis  HCT as prescribed.  HYPERLIPIDEMIA: Your cholesterol levels are well-controlled with pravastatin . -Continue taking pravastatin  as prescribed.  GENERALIZED ANXIETY DISORDER: Your anxiety has increased due to your husband's cancer diagnosis. Venlafaxine  is effective, but additional support may be beneficial. -Consider counseling for additional support.  RECURRENT HERPES LABIALIS: You are not experiencing frequent flare-ups, but stress can make it worse. -Continue taking famciclovir  as needed. -Consider daily preventive therapy if flare-ups become more frequent.  GENERAL HEALTH MAINTENANCE: Due to your husband's chemotherapy, it is recommended that you get a flu shot. -Get your flu shot today.

## 2023-09-19 NOTE — Assessment & Plan Note (Addendum)
 Lab Results  Component Value Date   HGBA1C 6.8 (H) 02/28/2023   HGBA1C 6.2 04/11/2022   HGBA1C 6.3 10/16/2021   Lab Results  Component Value Date   MICROALBUR <0.7 02/28/2023   LDLCALC 68 02/28/2023   CREATININE 0.67 02/28/2023   Synjardy  was changed to metformin  due to insurance coverage.  Will update A1C, continue metformin . Further rec pending review of A1C.

## 2023-09-19 NOTE — Progress Notes (Signed)
 Subjective:     Patient ID: Brittany Reyes, female    DOB: 03-29-1965, 58 y.o.   MRN: [id number]  Chief Complaint  Patient presents with   Hypertension    Here for follow up     Hypertension    Discussed the use of AI scribe software for clinical note transcription with the patient, who gave verbal consent to proceed.  History of Present Illness  Brittany Reyes is a 58 year old female with hypertension and type 2 diabetes who presents for a medication follow-up.  She continues to take Micardis  HCT for hypertension and feels her venlafaxine  is under increased demand due to stress from her husband's cancer diagnosis. Her cholesterol is well-controlled with pravastatin , which she continues as prescribed. She takes metformin  1000 mg twice daily for type 2 diabetes after switching from Synjardy  due to insurance issues. She is awaiting her A1c results. Anxiety related to her husband's condition may be impacting her stress levels, potentially affecting her health and medication adherence. She has rescheduled her Pap smear due to her husband's chemotherapy schedule.     Health Maintenance Due  Topic Date Due   Zoster Vaccines- Shingrix (1 of 2) Never done   Cervical Cancer Screening (HPV/Pap Cotest)  07/30/2023   Influenza Vaccine  08/15/2023   COVID-19 Vaccine (3 - 2025-26 season) 09/15/2023   HEMOGLOBIN A1C  08/28/2023    Past Medical History:  Diagnosis Date   Allergy    Anxiety    Diabetes mellitus without complication (HCC)    Hyperlipidemia    Hypertension     Past Surgical History:  Procedure Laterality Date   DILATION AND CURETTAGE OF UTERUS     1998   HERNIA REPAIR     age 48 and 2    Family History  Problem Relation Age of Onset   Dementia Mother    Heart attack Father    Valvular heart disease Sister        Mitral valve replacement   Suicidality Brother    Dementia Maternal Grandmother    CVA Maternal Grandfather    Breast cancer Paternal Grandmother     CVA Paternal Grandfather    Colon cancer Neg Hx    Esophageal cancer Neg Hx    Rectal cancer Neg Hx    Stomach cancer Neg Hx     Social History   Socioeconomic History   Marital status: Married    Spouse name: Not on file   Number of children: Not on file   Years of education: Not on file   Highest education level: Bachelor's degree (e.g., BA, AB, BS)  Occupational History   Not on file  Tobacco Use   Smoking status: Never   Smokeless tobacco: Never  Vaping Use   Vaping status: Never Used  Substance and Sexual Activity   Alcohol use: Yes    Comment: socially   Drug use: Never   Sexual activity: Yes    Partners: Male  Other Topics Concern   Not on file  Social History Narrative   Married   2 children   1 grandchild   Son in [place]   Daughter locally   Works as an Airline pilot   Enjoys reading, spending time with friends.    Has 1 dog   Completed bachelors degree   Social Drivers of Health   Financial Resource Strain: Patient Declined (09/19/2023)   Overall Financial Resource Strain (CARDIA)    Difficulty of Paying Living Expenses: Patient declined  Food Insecurity: No Food Insecurity (09/19/2023)   Hunger Vital Sign    Worried About Running Out of Food in the Last Year: Never true    Ran Out of Food in the Last Year: Never true  Transportation Needs: No Transportation Needs (09/19/2023)   PRAPARE - Administrator, Civil Service (Medical): No    Lack of Transportation (Non-Medical): No  Physical Activity: Insufficiently Active (09/19/2023)   Exercise Vital Sign    Days of Exercise per Week: 2 days    Minutes of Exercise per Session: 30 min  Stress: Patient Declined (09/19/2023)   [employer] of Occupational Health - Occupational Stress Questionnaire    Feeling of Stress: Patient declined  Social Connections: Moderately Integrated (09/19/2023)   Social Connection and Isolation Panel    Frequency of Communication with Friends and Family: More than  three times a week    Frequency of Social Gatherings with Friends and Family: Three times a week    Attends Religious Services: Patient declined    Active Member of Clubs or Organizations: Yes    Attends Banker Meetings: Patient declined    Marital Status: Married  Catering manager Violence: Not on file    Outpatient Medications Prior to Visit  Medication Sig Dispense Refill   loratadine (CLARITIN) 10 MG tablet Take by mouth daily.     ezetimibe  (ZETIA ) 10 MG tablet Take 1 tablet by mouth once daily 90 tablet 0   famciclovir  (FAMVIR ) 500 MG tablet Take 2 tabs by mouth now and 2 tabs in 12 hours at start of outbreak. 12 tablet 4   metFORMIN  (GLUCOPHAGE ) 1000 MG tablet TAKE 1 TABLET BY MOUTH TWICE DAILY WITH MEALS 180 tablet 0   pravastatin  (PRAVACHOL ) 40 MG tablet Take 1 tablet (40 mg total) by mouth daily. 90 tablet 0   telmisartan -hydrochlorothiazide  (MICARDIS  HCT) 80-25 MG tablet Take 1 tablet by mouth daily. 90 tablet 0   venlafaxine  XR (EFFEXOR -XR) 150 MG 24 hr capsule Take 1 capsule (150 mg total) by mouth daily with breakfast. 90 capsule 1   No facility-administered medications prior to visit.    Allergies  Allergen Reactions   Ace Inhibitors Other (See Comments)    Cough Cough     ROS See HPI    Objective:    Physical Exam Constitutional:      General: She is not in acute distress.    Appearance: Normal appearance. She is well-developed.  HENT:     Head: Normocephalic and atraumatic.     Right Ear: External ear normal.     Left Ear: External ear normal.  Eyes:     General: No scleral icterus. Neck:     Thyroid: No thyromegaly.  Cardiovascular:     Rate and Rhythm: Normal rate and regular rhythm.     Heart sounds: Normal heart sounds. No murmur heard. Pulmonary:     Effort: Pulmonary effort is normal. No respiratory distress.     Breath sounds: Normal breath sounds. No wheezing.  Musculoskeletal:     Cervical back: Neck supple.  Skin:     General: Skin is warm and dry.  Neurological:     Mental Status: She is alert and oriented to person, place, and time.  Psychiatric:        Mood and Affect: Mood normal.        Behavior: Behavior normal.        Thought Content: Thought content normal.  Judgment: Judgment normal.      BP 123/72 (BP Location: Right Arm, Patient Position: Sitting, Cuff Size: Large)   Pulse 84   Temp 98 F (36.7 C) (Oral)   Resp 16   Ht 5' 3 (1.6 m)   Wt 197 lb (89.4 kg)   SpO2 98%   BMI 34.90 kg/m  Wt Readings from Last 3 Encounters:  09/19/23 197 lb (89.4 kg)  02/28/23 219 lb (99.3 kg)  04/11/22 213 lb (96.6 kg)       Assessment & Plan:   Problem List Items Addressed This Visit       Unprioritized   Hypertension - Primary   BP Readings from Last 3 Encounters:  09/19/23 123/72  02/28/23 129/67  04/11/22 107/67  Stable on micardis  HCT.         Relevant Medications   pravastatin  (PRAVACHOL ) 40 MG tablet   telmisartan -hydrochlorothiazide  (MICARDIS  HCT) 80-25 MG tablet   ezetimibe  (ZETIA ) 10 MG tablet   Hyperlipidemia   Lab Results  Component Value Date   CHOL 125 02/28/2023   HDL 38.00 (L) 02/28/2023   LDLCALC 68 02/28/2023   TRIG 93.0 02/28/2023   CHOLHDL 3 02/28/2023   Stable on pravastatin , continue same.      Relevant Medications   pravastatin  (PRAVACHOL ) 40 MG tablet   telmisartan -hydrochlorothiazide  (MICARDIS  HCT) 80-25 MG tablet   ezetimibe  (ZETIA ) 10 MG tablet   genital herpes   Using famciclovir  prn. Not needing recently.        Relevant Medications   famciclovir  (FAMVIR ) 500 MG tablet   Diabetes mellitus without complication (HCC)   Lab Results  Component Value Date   HGBA1C 6.8 (H) 02/28/2023   HGBA1C 6.2 04/11/2022   HGBA1C 6.3 10/16/2021   Lab Results  Component Value Date   MICROALBUR <0.7 02/28/2023   LDLCALC 68 02/28/2023   CREATININE 0.67 02/28/2023   Synjardy  was changed to metformin  due to insurance coverage.  Will update A1C,  continue metformin . Further rec pending review of A1C.       Relevant Medications   pravastatin  (PRAVACHOL ) 40 MG tablet   telmisartan -hydrochlorothiazide  (MICARDIS  HCT) 80-25 MG tablet   metFORMIN  (GLUCOPHAGE ) 1000 MG tablet   Other Relevant Orders   HgB A1c   Basic Metabolic Panel (BMET)   Anxiety   She is managing her anxiety with effexor . We discussed the possibility of getting       Relevant Medications   venlafaxine  XR (EFFEXOR -XR) 150 MG 24 hr capsule    I have changed Staci L. Raimondi's metFORMIN  and ezetimibe . I am also having her maintain her loratadine, pravastatin , telmisartan -hydrochlorothiazide , venlafaxine  XR, and famciclovir .  Meds ordered this encounter  Medications   pravastatin  (PRAVACHOL ) 40 MG tablet    Sig: Take 1 tablet (40 mg total) by mouth daily.    Dispense:  90 tablet    Refill:  0    Supervising Provider:   DOMENICA BLACKBIRD A [[id number]]   telmisartan -hydrochlorothiazide  (MICARDIS  HCT) 80-25 MG tablet    Sig: Take 1 tablet by mouth daily.    Dispense:  90 tablet    Refill:  0    Supervising Provider:   DOMENICA BLACKBIRD A [[id number]]   venlafaxine  XR (EFFEXOR -XR) 150 MG 24 hr capsule    Sig: Take 1 capsule (150 mg total) by mouth daily with breakfast.    Dispense:  90 capsule    Refill:  1    Supervising Provider:   DOMENICA BLACKBIRD A [[id number]]   metFORMIN  (GLUCOPHAGE ) 1000  MG tablet    Sig: Take 1 tablet (1,000 mg total) by mouth 2 (two) times daily with a meal.    Dispense:  180 tablet    Refill:  0    Supervising Provider:   DOMENICA BLACKBIRD A [[id number]]   famciclovir  (FAMVIR ) 500 MG tablet    Sig: Take 2 tabs by mouth now and 2 tabs in 12 hours at start of outbreak.    Dispense:  12 tablet    Refill:  4    Supervising Provider:   DOMENICA BLACKBIRD A [[id number]]   ezetimibe  (ZETIA ) 10 MG tablet    Sig: Take 1 tablet (10 mg total) by mouth daily.    Dispense:  90 tablet    Refill:  0    Supervising Provider:   DOMENICA BLACKBIRD A [[id number]]

## 2023-09-19 NOTE — Assessment & Plan Note (Signed)
 Lab Results  Component Value Date   CHOL 125 02/28/2023   HDL 38.00 (L) 02/28/2023   LDLCALC 68 02/28/2023   TRIG 93.0 02/28/2023   CHOLHDL 3 02/28/2023   Stable on pravastatin , continue same.

## 2023-09-19 NOTE — Assessment & Plan Note (Signed)
 Using famciclovir  prn. Not needing recently.

## 2023-09-22 ENCOUNTER — Ambulatory Visit: Payer: Self-pay | Admitting: Family

## 2023-09-22 DIAGNOSIS — Z23 Encounter for immunization: Secondary | ICD-10-CM

## 2023-10-03 ENCOUNTER — Telehealth: Payer: Self-pay | Admitting: *Deleted

## 2023-10-03 DIAGNOSIS — B009 Herpesviral infection, unspecified: Secondary | ICD-10-CM

## 2023-10-03 NOTE — Telephone Encounter (Signed)
 Copied from CRM #[id number]. Topic: Clinical - Prescription Issue >> Oct 03, 2023  4:02 PM DeAngela L wrote: Reason for RMF:[id number] calling with [facility] about famciclovir  it will cost the patient $125 and the pharmacy is calling to ask if the provider could send alternate prescription that could be a little more cost effective for the patient  famciclovir  (FAMVIR ) 500 MG tablet  And please put on the escript replacing the famciclovir    [facility] [address] [place], [place] - [address] [place] [address] Phone: [phone] Fax: [phone]

## 2023-10-04 MED ORDER — VALACYCLOVIR HCL 1 G PO TABS: ORAL_TABLET | ORAL | 1 refills | Status: AC

## 2023-10-04 NOTE — Addendum Note (Signed)
 Addended by: DARYL SETTER on: 10/04/2023 12:05 PM   Modules accepted: Orders

## 2023-11-26 LAB — HM DIABETES EYE EXAM

## 2023-11-27 ENCOUNTER — Encounter: Payer: Self-pay | Admitting: Family

## 2023-12-06 ENCOUNTER — Other Ambulatory Visit: Payer: Self-pay | Admitting: Family

## 2023-12-06 DIAGNOSIS — E119 Type 2 diabetes mellitus without complications: Secondary | ICD-10-CM

## 2023-12-17 ENCOUNTER — Ambulatory Visit: Admitting: Family

## 2024-02-13 ENCOUNTER — Ambulatory Visit: Admitting: Family

## 2024-03-09 ENCOUNTER — Ambulatory Visit: Admitting: Family
